# Patient Record
Sex: Female | Born: 1970 | Hispanic: Yes | Marital: Married | Smoking: Never smoker
Health system: Southern US, Community
[De-identification: ages and names within clinical notes are randomized; demographics above are authoritative.]

## PROBLEM LIST (undated history)

## (undated) DIAGNOSIS — E781 Pure hyperglyceridemia: Secondary | ICD-10-CM

## (undated) DIAGNOSIS — D259 Leiomyoma of uterus, unspecified: Secondary | ICD-10-CM

## (undated) HISTORY — PX: NO PAST SURGERIES: SHX2092

## (undated) HISTORY — DX: Leiomyoma of uterus, unspecified: D25.9

## (undated) HISTORY — DX: Pure hyperglyceridemia: E78.1

---

## 2014-08-18 ENCOUNTER — Other Ambulatory Visit: Payer: Self-pay | Admitting: Family Medicine

## 2014-08-18 ENCOUNTER — Emergency Department (INDEPENDENT_AMBULATORY_CARE_PROVIDER_SITE_OTHER)
Admission: EM | Admit: 2014-08-18 | Discharge: 2014-08-18 | Disposition: A | Discharge status: Home / Self Care | Payer: Managed Care, Other (non HMO) | Source: Home / Self Care | Attending: Family Medicine | Admitting: Family Medicine

## 2014-08-18 ENCOUNTER — Encounter: Payer: Self-pay | Admitting: *Deleted

## 2014-08-18 DIAGNOSIS — R1031 Right lower quadrant pain: Secondary | ICD-10-CM | POA: Diagnosis not present

## 2014-08-18 LAB — POCT CBC W AUTO DIFF (K'VILLE URGENT CARE)

## 2014-08-18 LAB — POCT URINALYSIS DIP (MANUAL ENTRY)
Bilirubin, UA: NEGATIVE
GLUCOSE UA: NEGATIVE
Ketones, POC UA: NEGATIVE
Leukocytes, UA: NEGATIVE
Nitrite, UA: NEGATIVE
PH UA: 7.5 (ref 5–8)
Protein Ur, POC: NEGATIVE
SPEC GRAV UA: 1.02 (ref 1.005–1.03)
Urobilinogen, UA: 0.2 (ref 0–1)

## 2014-08-18 LAB — POCT WET + KOH PREP: WBC, Wet Prep HPF POC: NONE SEEN

## 2014-08-18 LAB — POCT URINE PREGNANCY: Preg Test, Ur: NEGATIVE

## 2014-08-18 NOTE — ED Notes (Signed)
Scheduled pelvic US with Hoyle Sauer @ [facility] for 08/19/14 @ 130pm

## 2014-08-18 NOTE — ED Provider Notes (Signed)
CSN: [id number]     Arrival date & time 08/18/14  6294 History   First MD Initiated Contact with Patient 08/18/14 [phone]     Chief Complaint  Patient presents with  . Pelvic Pain  . Dysuria     HPI Comments: Patient complains of dysuria and intermittent right lower quadrant pelvic pain for about one week.  No vaginal discharge.  She has Implanon in place, but had vaginal bleeding for 4 days last week.  No fevers, chills, and sweats.  No nausea/vomiting.   Patient is a 44 y.o. female presenting with abdominal pain. The history is provided by the patient and a relative. The history is limited by a language barrier.  Abdominal Pain Pain location:  RLQ and suprapubic Pain quality: aching   Pain radiates to:  Does not radiate Pain severity:  Mild Onset quality:  Gradual Duration:  3 days Timing:  Intermittent Progression:  Unchanged Chronicity:  New Context: not previous surgeries   Relieved by:  Nothing Worsened by:  Palpation Ineffective treatments:  Acetaminophen Associated symptoms: dysuria and vaginal bleeding   Associated symptoms: no anorexia, no chills, no fever, no nausea, no vaginal discharge and no vomiting     History reviewed. No pertinent past medical history. History reviewed. No pertinent past surgical history. Family History  Problem Relation Age of Onset  . Diabetes Mother   . Hypertension Mother    History  Substance Use Topics  . Smoking status: Never Smoker   . Smokeless tobacco: Never Used  . Alcohol Use: No   OB History    No data available     Review of Systems  Constitutional: Negative for fever and chills.  Gastrointestinal: Positive for abdominal pain. Negative for nausea, vomiting and anorexia.  Genitourinary: Positive for dysuria and vaginal bleeding. Negative for vaginal discharge.  All other systems reviewed and are negative.   Allergies  Review of patient's allergies indicates no known allergies.  Home Medications   Prior to Admission  medications   Medication Sig Start Date End Date Taking? Authorizing Provider  Etonogestrel (IMPLANON [place]) Inject into the skin.   Yes Historical Provider, MD   BP 107/71 mmHg  Pulse 61  Temp(Src) 98.3 F (36.8 C) (Oral)  Resp 14  Wt 144 lb (65.318 kg)  SpO2 99%  LMP 08/10/2014 (Approximate) Physical Exam  Constitutional: She is oriented to person, place, and time. She appears well-developed and well-nourished. No distress.  HENT:  Head: Normocephalic.  Nose: Nose normal.  Mouth/Throat: Oropharynx is clear and moist.  Eyes: Conjunctivae are normal. Pupils are equal, round, and reactive to light.  Neck: Neck supple.  Cardiovascular: Normal heart sounds.   Pulmonary/Chest: Breath sounds normal.  Abdominal: Soft. Normal appearance and bowel sounds are normal. She exhibits no distension and no mass. There is no hepatosplenomegaly. There is tenderness in the right lower quadrant. There is guarding and tenderness at McBurney's point. There is no rigidity, no rebound, no CVA tenderness and negative Murphy's sign. Hernia confirmed negative in the right inguinal area and confirmed negative in the left inguinal area.    There is distinct tenderness to palpation right lower quadrant over McBurney's point as noted on diagram. Iliopsoas and obdurator tests are negative.  Genitourinary: Vagina normal. There is no rash, tenderness, lesion or injury on the right labia. There is no rash, tenderness, lesion or injury on the left labia. No erythema, tenderness or bleeding in the vagina. No foreign body around the vagina. No vaginal discharge found.  Vulva appears normal without lesions or erythema.  Vagina has normal mucosae without lesions and no discharge in the vaginal vault.  Cervix appears normal without lesions.  There is no discharge present in the cervical os.  No cervical motion tenderness is present. Uterus is small and non-tender.  Right adnexae mildly tender without masses; left adnexae normal.    Musculoskeletal: She exhibits no edema.  Lymphadenopathy:    She has no cervical adenopathy.  Neurological: She is alert and oriented to person, place, and time.  Skin: Skin is warm and dry. No rash noted.  Nursing note and vitals reviewed.   ED Course  Procedures  None     Labs Reviewed  POCT URINALYSIS DIP (MANUAL ENTRY) - Abnormal; Notable for the following:    Blood, UA trace-intact (*)    All other components within normal limits  POCT URINE PREGNANCY negative  POCT CBC W AUTO DIFF ([facility]):  WBC 5.9; LY 37.4; MO 7.9; GR 54.7; Hgb 13.1; Platelets 265 POCT KOH/Wet Prep:  Negative yeast; negative WBC; Epithelial cells present; Negative Clue cells:  Negative trich; Few bacteria.     MDM   1. Abdominal pain, right lower quadrant; normal WBC reassuring.  ?right ovarian cyst   Schedule pelvic ultrasound (tomorrow at [facility] 1:30pm) May take Ibuprofen 200mg , 4 tabs every 8 hours with food.     Kandra Nicolas, MD 08/18/14 [phone]

## 2014-08-18 NOTE — Discharge Instructions (Signed)
Recommend Ibuprofen 200mg , 4 tabs every 8 hours with food.

## 2014-08-18 NOTE — ED Notes (Signed)
Pt c/o intermittent pelvic pain and dysuria x 3 days. There is a language barrier but what i gathered, she has an implanon but does get a period and had vaginal bleeding 4 days last week which she believes was her period.

## 2014-08-19 ENCOUNTER — Ambulatory Visit (HOSPITAL_BASED_OUTPATIENT_CLINIC_OR_DEPARTMENT_OTHER): Payer: Managed Care, Other (non HMO)

## 2014-08-19 ENCOUNTER — Ambulatory Visit (HOSPITAL_BASED_OUTPATIENT_CLINIC_OR_DEPARTMENT_OTHER): Admission: RE | Admit: 2014-08-19 | Payer: Managed Care, Other (non HMO) | Source: Ambulatory Visit

## 2014-08-19 LAB — GC/CHLAMYDIA PROBE AMP
CT Probe RNA: NEGATIVE
GC Probe RNA: NEGATIVE

## 2016-05-02 ENCOUNTER — Ambulatory Visit (INDEPENDENT_AMBULATORY_CARE_PROVIDER_SITE_OTHER): Payer: Managed Care, Other (non HMO) | Admitting: Physician Assistant

## 2016-05-02 ENCOUNTER — Other Ambulatory Visit: Payer: Self-pay

## 2016-05-02 ENCOUNTER — Encounter: Payer: Self-pay | Admitting: Physician Assistant

## 2016-05-02 ENCOUNTER — Ambulatory Visit: Payer: Managed Care, Other (non HMO) | Admitting: Physician Assistant

## 2016-05-02 VITALS — BP 131/77 | HR 66 | Ht 62.0 in | Wt 150.0 lb

## 2016-05-02 DIAGNOSIS — Z0000 Encounter for general adult medical examination without abnormal findings: Secondary | ICD-10-CM

## 2016-05-02 DIAGNOSIS — E663 Overweight: Secondary | ICD-10-CM

## 2016-05-02 LAB — CBC
HCT: 39.8 % (ref 35.0–45.0)
Hemoglobin: 13.3 g/dL (ref 11.7–15.5)
MCH: 31.4 pg (ref 27.0–33.0)
MCHC: 33.4 g/dL (ref 32.0–36.0)
MCV: 93.9 fL (ref 80.0–100.0)
MPV: 10.2 fL (ref 7.5–12.5)
Platelets: 331 10*3/uL (ref 140–400)
RBC: 4.24 MIL/uL (ref 3.80–5.10)
RDW: 13.7 % (ref 11.0–15.0)
WBC: 7.7 10*3/uL (ref 3.8–10.8)

## 2016-05-02 NOTE — Patient Instructions (Signed)
Cuidados preventivos en las mujeres de entre 7 y [phone] (Preventive Care 40-64 Years, Female) Los cuidados preventivos hacen referencia a las opciones en cuanto al estilo de vida y a las visitas al mdico, las cuales pueden promover la salud y Musician. QU INCLUYEN LOS CUIDADOS PREVENTIVOS?  Un examen fsico anual. Tambin se lo conoce como control de rutina anual.  Exmenes dentales una o dos veces al ao.  Exmenes oculares de [place]. Pregntele al mdico con qu frecuencia debe hacerse controles oculares.  Opciones personales en cuanto al estilo de vida, entre ellas:  [place] y las piezas dentales.  Realizar actividad fsica con regularidad.  Consumir una dieta saludable.  Evitar el consumo de tabaco y de drogas.  Limitar el consumo de bebidas alcohlicas.  Counsellor.  Tomar aspirina de baja dosis diariamente a [employer].  Tomar los suplementos vitamnicos y [employer] se lo haya indicado el mdico. QU SUCEDE DURANTE UN CONTROL DE [place]?  Los servicios y las pruebas de deteccin que el mdico realiza durante un control de rutina anual dependern de la edad, el estado de salud general, los factores de riesgo relacionados con el estilo de vida y los antecedentes familiares de enfermedades. Psicoterapia  El mdico puede hacerle preguntas sobre lo siguiente:  Consumo de alcohol.  Consumo de tabaco.  Consumo de drogas.  [place] familiar y en las relaciones.  Actividad sexual.  Hbitos de alimentacin.  Trabajo y entorno laboral.  Mtodos anticonceptivos.  El ciclo menstrual.  Antecedentes de embarazos. Pruebas de deteccin  Pueden hacerle las siguientes pruebas o mediciones:  Estatura, peso e ndice de masa corporal [facility]).  La presin arterial.  Niveles de lpidos y colesterol. Estos se pueden controlar cada 5aos o con mayor frecuencia si tiene ms de  50aos.  Controles de la piel.  Pruebas de deteccin de cncer de pulmn. Pueden hacerle estas pruebas todos los aos a partir de los 55aos si ha fumado durante 30aos un paquete diario y sigue fumando o dej el hbito en algn momento en los ltimos 15aos.  Prueba de Personnel officer en las heces [facility]). Pueden hacerle esta prueba todos los aos a partir de [place].  Sigmoidoscopia flexible o colonoscopia. Pueden hacerle una sigmoidoscopia cada 71aos o una colonoscopia cada 10aos a partir de los 50aos.  Anlisis de sangre de hepatitisC.  Anlisis de sangre de hepatitisB.  Pruebas de deteccin de enfermedades de transmisin sexual (ETS).  Pruebas de deteccin de la diabetes. Para realizarlas, se controla el nivel de azcar en la sangre (glucemia) despus de que haya pasado un rato sin comer (ayuno). Pueden hacerle esta prueba cada 1 a 3aos.  Mamografa. Pueden hacerle este estudio cada 1 o 2aos. Hable con el mdico acerca de cundo debe comenzar a Nutritional therapist. Esto depende de si tiene antecedentes familiares de cncer de mama.  Pruebas de deteccin de tumores malignos relacionados con el BRCA. Se pueden realizar si tiene antecedentes familiares de cncer de mama, de ovarios, de trompas o de peritoneo.  Examen plvico y prueba de Papanicolaou. Se pueden realizar cada 3aos, a partir de los 21aos. A partir de los 30aos, se pueden realizar cada 5aos si le hacen una prueba de Papanicolaou en combinacin con un anlisis del virus del Engineer, technical sales (VPH).  Densitometra sea. Se realiza para detectar la presencia de osteoporosis. Pueden hacerle este estudio si corre un alto riesgo de tener osteoporosis. Hable con el mdico  sobre los [employer], las opciones de tratamiento y, si es necesario, la necesidad de Optometrist ms [employer]. Vacunas  El mdico puede recomendarle que se aplique algunas vacunas, por ejemplo:  [place] antigripal. Se  recomienda su aplicacin todos los aos.  Vacuna contra la difteria, el ttanos y Research officer, trade union (Tdap, Td). Puede que tenga que aplicarse un refuerzo contra el ttanos y la difteria (Td) cada 10aos.  Vacuna contra la varicela. Es posible que tenga que aplicrsela si no recibi esta vacuna.  Vacuna contra el herpes zster. Tal vez deba aplicrsela despus de los 60aos.  Vacuna contra el sarampin, la rubola y las paperas ([place]). Tal vez deba aplicarse al menos una dosis de SRP si naci [phone] o despus de ese ao. Podra tambin necesitar una segunda dosis.  Vacuna antineumoccica conjugada 13valente (PCV13). Puede necesitar esta vacuna si tiene determinadas enfermedades y no se vacun anteriormente.  Vacuna antineumoccica de polisacridos (PPSV23). Quizs tenga que aplicarse una o dos dosis si fuma o si sufre determinadas enfermedades.  Vacuna antimeningoccica. Puede necesitar esta vacuna si tiene determinadas enfermedades.  Vacuna contra la hepatitis A. Es posible que tenga que aplicarse esta vacuna si tiene determinadas enfermedades, o si trabaja en lugares donde puede estar expuesta a la hepatitisA o viaja a estas zonas.  Vacuna contra la hepatitis B. Es posible que tenga que aplicarse esta vacuna si tiene determinadas enfermedades, o si trabaja en lugares donde puede estar expuesta a la hepatitisB o viaja a estas zonas.  Vacuna antihaemophilus influenzae tipoB (Hib). Puede necesitar esta vacuna si tiene determinadas enfermedades. Hable con el [place] deteccin y las vacunas que tiene que [place], y con qu frecuencia debe hacerlo. Esta informacin no tiene Marine scientist el consejo del mdico. Asegrese de hacerle al mdico cualquier pregunta que tenga. Document Reviewed: 12/19/2014 Elsevier Interactive Patient Education  2017 [place] dental preventivo en los adultos (Preventive Dental Care, Adult) El cuidado dental  preventivo incluye visitar al dentista con regularidad y mantener una buena higiene bucal en el hogar. Estas medidas pueden ayudar a prevenir las caries y otros problemas dentales, problemas en el canal radicular, enfermedad en las encas (gingivitis) y la prdida de dientes. Los exmenes dentales regulares tambin pueden ayudar al dentista a diagnosticar otros problemas mdicos. Muchas enfermedades, incluido el cncer en la boca, presentan signos iniciales que pueden detectarse durante una visita de cuidado dental preventivo. QU ESPERAR EN LAS VISITAS AL DENTISTA? Muchos adultos visitan al dentista una o dos veces al ao para que les hagan una limpieza y exmenes bucales. Hable con el dentista sobre el mejor cuidado dental preventivo para usted. En la visita, el dentista puede hacerle preguntas acerca de lo siguiente:  Su dieta y salud general.  Cualquier sntoma nuevo, como los siguientes:  Encas sangrantes.  Dolor en la boca, los dientes o la [place]. El Production assistant, radio la boca (examen bucal) para verificar lo siguiente:  Caries.  Gingivitis u otros problemas.  Signos de cncer.  Hinchazn o bultos en el cuello.  Movimiento anormal de la mandbula o dolor en la articulacin de la [place]. Probablemente tambin deba hacerse lo siguiente:  Radiografas dentales.  Limpieza dental. Si usted tiene un problema en una etapa inicial, como una caries, el dentista programar una cita para brindarle tratamiento. Si tiene un problema en la raz del diente, enfermedad en las encas o un signo de otra enfermedad, el dentista puede derivarlo a Office manager. DE  QU FORMA PUEDO CUIDARME LOS DIENTES EN MI CASA?  Cepllese los dientes con una pasta dental con flor aprobada todas las [place] y las noches. Si es posible, cepllese antes de que pasen 63mnutos de haber terminado cada comida.  Psese el hilo dental una vez al da, tUS Airways  Despus del cepillado, contrlese  de forma peridica los dientes para ver si tienen manchas marrones o blancas. Estas pueden ser signos de caries.  Examine las encas para detectar sangrado o hinchazn. Estos pueden ser signos de enfermedad en las encas, como gingivitis o periodontitis.  Asegrese de que su dieta incluya muchas frutas, verduras, lBahrainy otros productos lcteos, cereales integrales y protenas. No coma muchos alimentos con almidn o azcar agregada. Hable con el mdico si tiene preguntas sobre cmo seguir una dieta saludable.  EIraancolaciones azucaradas y los cNurse, mental health  No fume.  No se haga perforaciones en la boca.  Si tiene dRegions Financial Corporationo las encas, haga grgaras con una mezcla de agua y sal 3 o 4veces al da, o cuando sea necesario. Para preparar la mezcla de agua con sal, disuelva por completo media a 1cucharadita de sal en 1taza de agua tibia.  Tome los medicamentos de venta libre y los recetados solamente como se lo haya indicado el dentista.  Si se le cae un diente permanente debido a un golpe:  ESoftware engineer  Recjalo desde la parte de arriba (corona) con un pauelo de papel o una gasa.  Lvelo durante un mximo de 10segundos con agua corriente fra.  Trate de volver a ponerlo en el hueco de la enca.  Si no puede volver a cCivil Service fast streamer pngalo en un vaso con lNortheast Utilities  Vaya al consultorio del dentista de inmediato. Lleve el diente. CBrownton Llame al dentista si presenta lo siguiente:  DLiberty Global los dientes o la mM[place]  Enrojecimiento, hinchazn o sangrado en las encas.  Uno o ms dientes muy sensibles al calor o al fro.  Aliento muy feo.  Un problema con un empaste, una corona, un implante o la dentadura postiza.  Un diente roto o flojo.  Un crecimiento o una lcera que no desaparecen en la boca. PMarthenia RollingMS INFORMACIN Asociacin Odontolgica Estadounidense  (American Dental Association): whttp://clayton-rivera.info/Esta informacin no tiene cMarine scientistel consejo del mdico. Asegrese de hacerle al mdico cualquier pregunta que tenga. Document Released: 01/29/2015 Document Revised: 01/29/2015 Document Reviewed: 08/01/2014 Elsevier Interactive Patient Education  2017 EReynolds American

## 2016-05-02 NOTE — Progress Notes (Addendum)
HPI:                                                                Sabrina Berger is a 46 y.o. female who presents to [place]: [place] [facility] today for employment physical exam  Current Concerns include: would like to have biometric form completed for her work physical  Health Maintenance There are no preventive care reminders to display for this patient.  GYN/Sexual Health  Followed by GYN  Last pap smear: 09/21/13, negative for IEL, HPV neg  History of abnormal pap smears: no  Sexually active: not currently  Current contraception: Nexplanon (01/2013) and OCP's  Health Habits  Diet: fair  Exercise: walks x 30 min   Past Medical History:  Diagnosis Date  . Hypertriglyceridemia   . Uterine fibroid    Past Surgical History:  Procedure Laterality Date  . NO PAST SURGERIES     Social History  Substance Use Topics  . Smoking status: Never Smoker  . Smokeless tobacco: Never Used  . Alcohol use No   family history includes Diabetes in her mother; Hypertension in her mother.  ROS: negative except as noted in the HPI  Medications: Current Outpatient Prescriptions  Medication Sig Dispense Refill  . Etonogestrel (IMPLANON [place]) Inject into the skin.    Marland Kitchen SPRINTEC 28 0.25-35 MG-MCG tablet      No current facility-administered medications for this visit.    No Known Allergies     Objective:  BP 131/77   Pulse 66   Ht 5\' 2"  (1.575 m)   Wt 150 lb (68 kg)   BMI 27.44 kg/m  Gen: well-groomed, cooperative, not ill-appearing, no distress HEENT: normal conjunctiva, TM's clear, poor dentition, oropharynx clear, moist mucus membranes, no thyromegaly or tenderness Pulm: Normal work of breathing, clear to auscultation bilaterally CV: Normal rate, regular rhythm, s1 and s2 distinct, no murmurs, clicks or rubs appreciated on this exam, no carotid bruit, no peripheral edema GI: soft, nondistended, nontender, no masses Neuro: alert and  oriented x 3, EOM's intact, PERRLA, DTR's intact MSK: strength 5/5 and symmetric, normal gait  Skin: warm and dry, no rashes or lesions on exposed skin Psych: normal affect, pleasant mood, normal speech and thought content   No results found for this or any previous visit (from the past 72 hour(s)). No results found.    Assessment and Plan: 46 y.o. female with   1. Encounter for preventative adult health care examination - CBC - COMPLETE METABOLIC PANEL WITH GFR - Hemoglobin A1c - Lipid Panel w/reflex Direct LDL - Pap up to date - Tdap up to date per patient - Mammo - patient will obtain through GYN   Patient education and anticipatory guidance given Patient agrees with treatment plan Follow-up in 1 year or sooner as needed  Darlyne Russian PA-C

## 2016-05-03 LAB — LIPID PANEL W/REFLEX DIRECT LDL
Cholesterol: 154 mg/dL (ref <200)
HDL: 87 mg/dL (ref >50)
LDL-Cholesterol: 48 mg/dL
NON-HDL CHOLESTEROL (CALC): 67 mg/dL (ref <130)
Total CHOL/HDL Ratio: 1.8 Ratio (ref <5.0)
Triglycerides: 106 mg/dL (ref <150)

## 2016-05-03 LAB — COMPLETE METABOLIC PANEL WITHOUT GFR
ALT: 28 U/L (ref 6–29)
AST: 30 U/L (ref 10–35)
Albumin: 3.8 g/dL (ref 3.6–5.1)
Alkaline Phosphatase: 34 U/L (ref 33–115)
BUN: 9 mg/dL (ref 7–25)
CO2: 24 mmol/L (ref 20–31)
Calcium: 8.8 mg/dL (ref 8.6–10.2)
Chloride: 101 mmol/L (ref 98–110)
Creat: 0.67 mg/dL (ref 0.50–1.10)
GFR, Est African American: >89 mL/min
GFR, Est Non African American: >89 mL/min
Glucose, Bld: 67 mg/dL (ref 65–99)
Potassium: 4.3 mmol/L (ref 3.5–5.3)
Sodium: 139 mmol/L (ref 135–146)
Total Bilirubin: 0.5 mg/dL (ref 0.2–1.2)
Total Protein: 6.9 g/dL (ref 6.1–8.1)

## 2016-05-03 LAB — HEMOGLOBIN A1C
HEMOGLOBIN A1C: 5.1 % (ref <5.7)
Mean Plasma Glucose: 100 mg/dL

## 2016-05-09 ENCOUNTER — Ambulatory Visit: Payer: Managed Care, Other (non HMO) | Admitting: Physician Assistant

## 2017-01-21 ENCOUNTER — Ambulatory Visit (INDEPENDENT_AMBULATORY_CARE_PROVIDER_SITE_OTHER): Payer: 59 | Admitting: Obstetrics & Gynecology

## 2017-01-21 ENCOUNTER — Encounter: Payer: Self-pay | Admitting: *Deleted

## 2017-01-21 ENCOUNTER — Encounter: Payer: Self-pay | Admitting: Obstetrics & Gynecology

## 2017-01-21 VITALS — BP 138/82 | HR 75 | Resp 16 | Ht 62.0 in | Wt 148.0 lb

## 2017-01-21 DIAGNOSIS — D219 Benign neoplasm of connective and other soft tissue, unspecified: Secondary | ICD-10-CM | POA: Diagnosis not present

## 2017-01-21 DIAGNOSIS — Z124 Encounter for screening for malignant neoplasm of cervix: Secondary | ICD-10-CM | POA: Diagnosis not present

## 2017-01-21 DIAGNOSIS — Z0000 Encounter for general adult medical examination without abnormal findings: Secondary | ICD-10-CM

## 2017-01-21 DIAGNOSIS — N938 Other specified abnormal uterine and vaginal bleeding: Secondary | ICD-10-CM

## 2017-01-21 DIAGNOSIS — Z1151 Encounter for screening for human papillomavirus (HPV): Secondary | ICD-10-CM

## 2017-01-21 LAB — CBC
HCT: 37.3 % (ref 35.0–45.0)
Hemoglobin: 12.5 g/dL (ref 11.7–15.5)
MCH: 31.3 pg (ref 27.0–33.0)
MCHC: 33.5 g/dL (ref 32.0–36.0)
MCV: 93.5 fL (ref 80.0–100.0)
MPV: 11 fL (ref 7.5–12.5)
Platelets: 283 10*3/uL (ref 140–400)
RBC: 3.99 10*6/uL (ref 3.80–5.10)
RDW: 12.5 % (ref 11.0–15.0)
WBC: 8 10*3/uL (ref 3.8–10.8)

## 2017-01-21 LAB — TSH: TSH: 1.04 mIU/L

## 2017-01-21 NOTE — Progress Notes (Signed)
Patient ID: Sabrina Berger, female   DOB: 30-Jun-1970, 46 y.o.   MRN: [id number]  Chief Complaint  Patient presents with  . Menstrual Problem    HPI Sabrina Berger is a 46 y.o. female.  Sabrina Berger here with a problem of heavy bleeding, dailly, for 2 years. She may stop for a week on occasion. She had an u/s in 2015 that showed a 6 cm fibroid. Her hbg was 13.3 in March of this year.   HPI  Past Medical History:  Diagnosis Date  . Hypertriglyceridemia   . Uterine fibroid     Past Surgical History:  Procedure Laterality Date  . NO PAST SURGERIES      Family History  Problem Relation Age of Onset  . Diabetes Mother   . Hypertension Mother   . Uterine cancer Paternal Aunt     Social History Social History   Tobacco Use  . Smoking status: Never Smoker  . Smokeless tobacco: Never Used  Substance Use Topics  . Alcohol use: No  . Drug use: No    No Known Allergies  Current Outpatient Medications  Medication Sig Dispense Refill  . SPRINTEC 28 0.25-35 MG-MCG tablet     . Etonogestrel (IMPLANON [place]) Inject into the skin.     No current facility-administered medications for this visit.     Review of Systems Review of Systems She uses OCPs for contraception, but she does not remember the name of them.  Blood pressure 138/82, pulse 75, resp. rate 16, height 5\' 2"  (1.575 m), weight 148 lb (67.1 kg).  Physical Exam Physical Exam Well nourished, well hydrated white female, no apparent distress Breathing, conversing, and ambulating normally Interpretor present for encounter Abd- benign Cervix appears normal, no bleeding today noted Pap smear obtained Bimanual exam reveals a mobile 14 week size uterus Moderate pubic arch  Data Reviewed U/S from [facility]  Assessment    DUB    Plan    Check gyn  U/s, cbc, tsh Pap smear today Come back 4 weeks       Sabrina Berger C Sabrina Berger 01/21/2017, 11:02 AM

## 2017-01-26 LAB — CYTOLOGY - PAP
Diagnosis: NEGATIVE
HPV (WINDOPATH): NOT DETECTED

## 2017-01-30 ENCOUNTER — Ambulatory Visit (INDEPENDENT_AMBULATORY_CARE_PROVIDER_SITE_OTHER): Payer: 59

## 2017-01-30 ENCOUNTER — Other Ambulatory Visit: Payer: 59

## 2017-01-30 DIAGNOSIS — D250 Submucous leiomyoma of uterus: Secondary | ICD-10-CM

## 2017-01-30 DIAGNOSIS — D219 Benign neoplasm of connective and other soft tissue, unspecified: Secondary | ICD-10-CM

## 2017-02-09 ENCOUNTER — Ambulatory Visit: Payer: 59 | Admitting: Obstetrics & Gynecology

## 2017-02-12 ENCOUNTER — Ambulatory Visit (INDEPENDENT_AMBULATORY_CARE_PROVIDER_SITE_OTHER): Payer: 59 | Admitting: Obstetrics and Gynecology

## 2017-02-12 ENCOUNTER — Encounter: Payer: Self-pay | Admitting: *Deleted

## 2017-02-12 ENCOUNTER — Encounter: Payer: Self-pay | Admitting: Obstetrics and Gynecology

## 2017-02-12 VITALS — BP 132/83 | HR 90 | Resp 16 | Ht 62.0 in | Wt 148.0 lb

## 2017-02-12 DIAGNOSIS — Z712 Person consulting for explanation of examination or test findings: Secondary | ICD-10-CM

## 2017-02-12 DIAGNOSIS — N938 Other specified abnormal uterine and vaginal bleeding: Secondary | ICD-10-CM

## 2017-02-12 NOTE — Patient Instructions (Signed)
Eleccin del mtodo anticonceptivo (Contraception Choices) La anticoncepcin (control de la natalidad) es el uso de cualquier mtodo o dispositivo para Therapist, occupational. A continuacin se indican algunos de esos mtodos. [place] contraconceptivo consiste en un tubo plstico delgado que contiene la hormona progesterona. No contiene estrgenos. El mdico inserta el tubo en la parte interna del brazo. El tubo puede Nutritional therapist durante 3 aos. Despus de los 3 aos debe retirarse. El implante impide que los ovarios liberen vulos (ovulacin), espesa el moco cervical, lo que evita que los espermatozoides ingresen al tero y hace ms delgada la membrana que cubre el interior del tero.  Inyecciones de progesterona sola: las Water engineer cada 3 meses para Therapist, occupational. La progesterona sinttica impide que los ovarios liberen vulos. Tambin hacen que el moco cervical se espese y modifique el tejido de recubrimiento interno del tero. Esto hace ms difcil que los espermatozoides sobrevivan en el tero.  Las pldoras anticonceptivas contienen estrgenos y Immunologist. Su funcin es [employer] ovarios liberen vulos (ovulacin). Las hormonas de los anticonceptivos orales hacen que el moco cervical se haga ms espeso, lo que evita que el esperma ingrese al tero. Las pldoras anticonceptivas son recetadas por el mdico.Tambin se utilizan para tratar los perodos menstruales abundantes.  Minipldora: este tipo de pldora anticonceptiva contiene slo hormona progesterona. Deben tomarse todos los das del mes y debe recetarlas el mdico.  El parche de control de natalidad: contiene hormonas similares a las que contienen las pldoras anticonceptivas. Deben cambiarse una vez por semana y se utilizan bajo prescripcin mdica.  Anillo vaginal: contiene hormonas similares a las que contienen las pldoras anticonceptivas. Se deja colocado durante tres semanas, se  lo retira durante 1 semana y luego se coloca uno nuevo. La paciente debe sentirse cmoda al insertar y retirar el anillo de la vagina.Es necesaria la prescripcin mdica.  Anticonceptivos de emergencia: son mtodos para evitar un embarazo despus de Ardelia Mems relacin sexual sin proteccin. Esta pldora puede tomarse inmediatamente despus de Office manager sexuales o hasta 5 [place] de haber tenido sexo sin proteccin. Es ms efectiva si se toma poco tiempo despus de la relacin sexual. Los anticonceptivos de emergencia estn disponibles sin prescripcin mdica. Consltelo con su farmacutico. No use los anticonceptivos de emergencia como nico mtodo anticonceptivo.  MTODOS DE [place]  Condn masculino: es una vaina delgada (ltex o goma) que se coloca cubriendo al pene durante el acto sexual. Grier Rocher con espermicida para aumentar la efectividad.  Condn femenino. Es una funda delicada y blanda que se adapta holgadamente a la vagina antes de las Armed forces operational officer.  Diafragma: es una barrera de ltex redonda y suave que debe ser recomendado por un profesional. Se inserta en la vagina, junto con un gel espermicida. Debe insertarse antes de Clinical biochemist. Debe dejar el diafragma colocado en la vagina durante 6 a 8 horas despus de la relacin sexual.  Capuchn cervical: es una barrera de ltex o taza plstica redonda y [place] que cubre el cuello del tero y debe ser colocada por un mdico. Puede dejarlo colocado en la vagina hasta 48 horas despus [place].  Esponja: es una pieza blanda y circular de espuma de poliuretano. Contiene un espermicida. Se inserta en la vagina despus de mojarla y antes de las [employer].  Espermicidas: son sustancias qumicas que matan o bloquean al esperma y no lo dejan ingresar al cuello del tero y al tero. Vienen en  forma de cremas, geles, supositorios, espuma o comprimidos. No es necesario tener Furniture conservator/restorer. Se insertan en  la vagina con un aplicador antes de Clinical biochemist. El proceso debe repetirse cada vez que tiene relaciones sexuales.  ANTICONCEPTIVOS INTRAUTERINOS  Dispositivo intrauterino (DIU) es un dispositivo en forma de T que se coloca en el tero durante el perodo menstrual, para Therapist, occupational. Hay dos tipos: ? DIU de cobre: este tipo de DIU est recubierto con un alambre de cobre y se inserta dentro del tero. El cobre hace que el tero y las trompas de Falopio produzcan un liquido que [employer] espermatozoides. Puede permanecer colocado durante 10 aos. ? DIU con hormona: este tipo de DIU contiene la hormona progestina (progesterona sinttica). La hormona espesa el moco cervical y evita que los espermatozoides ingresen al tero y tambin afina la membrana que cubre el tero para evitar la implantacin del vulo fertilizado. La hormona debilita o destruye los espermatozoides que ingresan al tero. Puede Nutritional therapist durante 3-5 aos, segn el tipo de DIU que se [place].  MTODOS ANTICONCEPTIVOS PERMANENTES  Ligadura de trompas en la mujer: se realiza sellando, atando u obstruyendo quirrgicamente las trompas de Falopio lo que impide que el vulo descienda hacia el tero.  Esterilizacin histeroscpica: Implica la colocacin de un pequeo espiral o la insercin en cada trompa de Falopio. El mdico utiliza una tcnica llamada histeroscopa para Teacher, music procedimiento. El dispositivo produce la formacin de tejido Pensions consultant. Esto da como resultado una obstruccin permanente de las trompas de Falopio, de modo que la esperma no pueda fertilizar el vulo. Demora alrededor de 3 meses despus del procedimiento hasta que el conducto se obstruye. Tendr que usar otro mtodo anticonceptivo durante al menos 3 meses.  Esterilizacin masculina: se realiza ligando los conductos por los que pasan los espermatozoides (vasectoma).Esto impide que el esperma ingrese a la vagina durante el  acto sexual. Luego del procedimiento, el hombre puede eyacular lquido (semen).  MTODOS DE PLANIFICACIN NATURAL  Planificacin familiar natural: consiste en no Clinical biochemist o usar un mtodo de barrera (condn, [place], capuchn cervical) en los [employer] la mujer podra quedar [place].  Mtodo de calendario: consiste en el seguimiento de la duracin de cada ciclo menstrual y la identificacin de los perodos frtiles.  Mtodo de ovulacin: Dance movement psychotherapist las relaciones sexuales durante la ovulacin.  Mtodo sintotrmico: Energy manager sexuales en la poca en la que se est ovulando, utilizando un termmetro y tendiendo en cuenta los sntomas de la ovulacin.  Mtodo postovulacin: Museum/gallery conservator las relaciones sexuales para despus de haber ovulado. Independientemente del tipo o mtodo anticonceptivo que usted elija, es importante que use condones para protegerse contra las infecciones de transmisin sexual (ETS). Hable con su mdico con respecto a qu mtodo anticonceptivo es el ms apropiado para usted. Esta informacin no tiene Marine scientist el consejo del mdico. Asegrese de hacerle al mdico cualquier pregunta que tenga. Document Released: 02/17/2005 Document Revised: 10/20/2012 Document Reviewed: 08/12/2012 Elsevier Interactive Patient Education  2017 [place] endometrio (Endometrial Ablation) En la ablacin del endometrio se extirpa el recubrimiento interno del tero (endometrio). Este es generalmente un tratamiento que se Scientist, water quality, en forma ambulatoria. La ablacin permite evitar una ciruga mayor, como la ciruga para extirpar el cuello del tero y Nurse, learning disability (histerectoma). Luego de la ablacin del endometrio, tendr poco o nada de flujo menstrual y no podr tener hijos. Sin  embargo, si se encuentra en la etapa de la premenopausia, deber usar un mtodo confiable de control de la natalidad luego del  procedimiento, ya que hay una pequea probabilidad de que ocurra un Media planner. Hay diferentes razones para llevar a cabo este procedimiento, ellas son:  Perodos abundantes.  Sangrado que causa anemia.  Sangrado irregular.  Fibromas que sangran en la superficie interior del tero, si no miden ms de 3 cm. Tal vez no sea posible realizarle este procedimiento si:  Desea tener hijos en el futuro.  Tiene clicos intensos durante el perodo menstrual.  Tiene clulas precancerosas o cancerosas en el tero.  Tuvo un embarazo reciente.  Est cursando la menopausia.  Se someti a [place] mayor de tero, la cual le produjo el afinamiento de la pared [place]. Las cirugas pueden incluir lo siguiente: ? La extirpacin de uno o ms fibromas uterinos (miomectoma). ? Ardelia Mems cesrea con una incisin clsica (vertical) en el tero. Pregntele al mdico qu tipo de Pensions consultant. En ocasiones, la Arts development officer piel es diferente de la que se forma en el tero. Aunque se haya sometido a una ciruga uterina, algunos tipos de ablacin an pueden ser seguros para su caso. Hable con el mdico. INFORME A SU MDICO:  Cualquier alergia que tenga.  Todos los [employer] [place], incluyendo vitaminas, hierbas, gotas oftlmicas, cremas y medicamentos de venta libre.  Problemas previos que usted o los [employer] de su familia hayan tenido con el uso de anestsicos.  Enfermedades de [employer].  Cirugas previas.  Padecimientos mdicos.  RIESGOS Y COMPLICACIONES Generalmente, este es un procedimiento seguro. Sin embargo, Games developer procedimiento, pueden surgir complicaciones. Las complicaciones posibles son:  Perforacin del tero.  Hemorragias.  Infeccin en el tero, vejiga, o vagina.  Lesin en los rganos circundantes.  Ardelia Mems burbuja de aire en un pulmn (embolia de aire).  Embarazo luego del procedimiento.  Si el procedimiento no resuelve el problema, ser necesaria una  histerectoma.  Disminucin de la capacidad para Community education officer en el recubrimiento interno del tero. ANTES DEL PROCEDIMIENTO  Deber controlarse la membrana que recubre el tero para asegurarse de que no hay clulas cancerosas o precancerosas.  Le harn una ecografa para observar el tamao del tero y verificar si hay anormalidades.  Le darn medicamentos para Engineer, structural de la membrana que recubre el tero.  PROCEDIMIENTO Durante el procedimiento, el medico usar un instrumento llamado resectoscopio para ver el interior del tero. Hay diferentes formas de extirpar la membrana que cubre el tero.  Radiofrecuencia: en este mtodo se utiliza un aparato de radiofrecuencia, corriente elctrica alterna, para extirpar la membrana interna del tero.  Crioterapia: en este mtodo se [place] fro extremo para congelar la membrana del tero.  Lquido caliente: en este mtodo se utiliza solucin salina con el mismo objetivo.  Microondas: Se utilizan microondas de alta energa para calentar la membrana y extirparla.  Baln termal: consiste en insertar un catter con un baln en la punta dentro del tero. La punta del baln contiene un lquido caliente que elimina la membrana que tapiza el [place]. DESPUS DEL PROCEDIMIENTO Despus del procedimiento, no tenga relaciones sexuales ni inserte nada en la vagina hasta que su mdico la autorice. Despus del procedimiento podr experimentar:  Clicos.  Flujo vaginal.  Ganas de orinar con frecuencia. Esta informacin no tiene Marine scientist el consejo del mdico. Asegrese de hacerle al mdico cualquier pregunta que tenga. Document Released: 10/20/2012 Document Revised: 11/08/2014 Document Reviewed: 07/21/2012 Elsevier  Interactive Patient Education  2017 [employer].

## 2017-02-12 NOTE — Progress Notes (Signed)
46 yo [id number] here to discuss test results. Patient reports a long history of abnormal uterine bleeding. She reports irregular bleeding with Nexplanon in place. nexplanon was removed 3 years ago and patient was started on OCP. She reports daily spotting with OCP and over the past few months experiencing heavier vaginal bleeding for most of the month with 1 week bleeding-free interval. Patient denies any cramping or passage of clots.  Past Medical History:  Diagnosis Date  . Hypertriglyceridemia   . Uterine fibroid    Past Surgical History:  Procedure Laterality Date  . NO PAST SURGERIES     Family History  Problem Relation Age of Onset  . Diabetes Mother   . Hypertension Mother   . Uterine cancer Paternal Aunt    Social History   Tobacco Use  . Smoking status: Never Smoker  . Smokeless tobacco: Never Used  Substance Use Topics  . Alcohol use: No  . Drug use: No   ROS See pertinent in HPI  Blood pressure 132/83, pulse 90, resp. rate 16, height 5\' 2"  (1.575 m), weight 148 lb (67.1 kg). GENERAL: Well-developed, well-nourished female in no acute distress.  ABDOMEN: Soft, nontender, nondistended. No organomegaly. PELVIC: Normal external female genitalia. Vagina is pink and rugated.  Normal discharge. Normal appearing cervix. Uterus is normal in size. No adnexal mass or tenderness. EXTREMITIES: No cyanosis, clubbing, or edema, 2+ distal pulses.   01/2017 ultrasound FINDINGS: Uterus  Measurements: 11.1 x 6.7 x 7.2 cm. Heterogeneous myometrial echogenicity. Posterior mid LEFT uterine mass likely a leiomyoma 3.7 x 3.4 x 4.9 cm. This extends submucosal. No additional uterine masses. Incidental note of a small nabothian cysts in cervix.  Endometrium  Thickness: 8 mm thick, normal. No endometrial fluid or focal abnormality  Right ovary  Measurements: 3.3 x 1.5 x 2.1 cm. Normal morphology without mass. Internal blood flow present on color Doppler imaging.  Left  ovary  Not visualized on transvaginal imaging question due to high position versus obscuration by bowel gas.  Other findings:  No free pelvic fluid or adnexal masses  IMPRESSION: Submucosal uterine leiomyoma mid LEFT uterus posteriorly 3.7 x 3.4 x 4.9 cm.  Nonvisualization of LEFT ovary.   Electronically Signed   By: Lavonia Dana M.D.   On: 01/30/2017 12:54  A/P 46 yo with DUB - reviewed ultrasound finding - discussed benefits of endometrial biopsy ENDOMETRIAL BIOPSY     The indications for endometrial biopsy were reviewed.   Risks of the biopsy including cramping, bleeding, infection, uterine perforation, inadequate specimen and need for additional procedures  were discussed. The patient states she understands and agrees to undergo procedure today. Consent was signed. Time out was performed. Urine HCG was negative. A sterile speculum was placed in the patient's vagina and the cervix was prepped with Betadine. A single-toothed tenaculum was placed on the anterior lip of the cervix to stabilize it. The uterine cavity was sounded to a depth of 11 cm using the uterine sound. The 3 mm pipelle was introduced into the endometrial cavity without difficulty, 2 passes were made.  A  moderate amount of tissue was  sent to pathology. The instruments were removed from the patient's vagina. Minimal bleeding from the cervix was noted. The patient tolerated the procedure well.  Routine post-procedure instructions were given to the patient. The patient will follow up in two weeks to review the results and for further management.   - Pending results, discussed medical management with contraception such as IUD or surgical management  with endometrial biopsy - Patient will be contacted with results and will let us know of which management option she opted for - Screening mammogram ordered

## 2017-02-19 ENCOUNTER — Encounter (INDEPENDENT_AMBULATORY_CARE_PROVIDER_SITE_OTHER): Payer: Self-pay | Admitting: *Deleted

## 2017-02-20 ENCOUNTER — Ambulatory Visit: Payer: 59

## 2017-02-25 ENCOUNTER — Telehealth: Payer: Self-pay | Admitting: *Deleted

## 2017-02-25 NOTE — Telephone Encounter (Signed)
-----   Message from Mora Bellman, MD sent at 02/17/2017  1:17 PM EST ----- Please inform patient of negative endometrial biopsy for cancer. Her irregular bleeding is due to her transition towards menopause. She can return to the office to pursue medical management with IUD or endometrial ablation as previously discussed

## 2017-02-25 NOTE — Telephone Encounter (Signed)
Pt notified in person of her negative Endo Bx.  The bleeding is coming from her transition into menopause and can make appt to discuss opts.

## 2017-03-13 ENCOUNTER — Ambulatory Visit (INDEPENDENT_AMBULATORY_CARE_PROVIDER_SITE_OTHER): Payer: 59

## 2017-03-13 DIAGNOSIS — Z1231 Encounter for screening mammogram for malignant neoplasm of breast: Secondary | ICD-10-CM

## 2017-03-13 DIAGNOSIS — Z712 Person consulting for explanation of examination or test findings: Secondary | ICD-10-CM

## 2017-04-23 ENCOUNTER — Ambulatory Visit (INDEPENDENT_AMBULATORY_CARE_PROVIDER_SITE_OTHER): Payer: 59 | Admitting: Obstetrics and Gynecology

## 2017-04-23 ENCOUNTER — Encounter: Payer: Self-pay | Admitting: Obstetrics and Gynecology

## 2017-04-23 VITALS — BP 113/78 | HR 68 | Wt 148.0 lb

## 2017-04-23 DIAGNOSIS — N938 Other specified abnormal uterine and vaginal bleeding: Secondary | ICD-10-CM | POA: Diagnosis not present

## 2017-04-23 MED ORDER — MEGESTROL ACETATE 40 MG PO TABS: 40.0000 mg | ORAL_TABLET | Freq: Two times a day (BID) | ORAL | 5 refills | Status: DC

## 2017-04-23 NOTE — Progress Notes (Signed)
47 yo [id number] here for follow up on DUB. Patient reports daily spotting. She denies any cramping pain or abnormal discharge. She has been using OCP daily as instructed.  Past Medical History:  Diagnosis Date  . Hypertriglyceridemia   . Uterine fibroid    Past Surgical History:  Procedure Laterality Date  . NO PAST SURGERIES     Family History  Problem Relation Age of Onset  . Diabetes Mother   . Hypertension Mother   . Uterine cancer Paternal Aunt    Social History   Tobacco Use  . Smoking status: Never Smoker  . Smokeless tobacco: Never Used  Substance Use Topics  . Alcohol use: No  . Drug use: No   ROS See pertinent in HPI  Blood pressure 113/78, pulse 68, weight 148 lb (67.1 kg), last menstrual period 04/16/2017. GENERAL: Well-developed, well-nourished female in no acute distress.  ABDOMEN: Soft, nontender, nondistended. No organomegaly. PELVIC: Declined EXTREMITIES: No cyanosis, clubbing, or edema, 2+ distal pulses.  01/2017 sono FINDINGS: Uterus  Measurements: 11.1 x 6.7 x 7.2 cm. Heterogeneous myometrial echogenicity. Posterior mid LEFT uterine mass likely a leiomyoma 3.7 x 3.4 x 4.9 cm. This extends submucosal. No additional uterine masses. Incidental note of a small nabothian cysts in cervix.  Endometrium  Thickness: 8 mm thick, normal. No endometrial fluid or focal abnormality  Right ovary  Measurements: 3.3 x 1.5 x 2.1 cm. Normal morphology without mass. Internal blood flow present on color Doppler imaging.  Left ovary  Not visualized on transvaginal imaging question due to high position versus obscuration by bowel gas.  Other findings:  No free pelvic fluid or adnexal masses  IMPRESSION: Submucosal uterine leiomyoma mid LEFT uterus posteriorly 3.7 x 3.4 x 4.9 cm.  Nonvisualization of LEFT ovary.   Electronically Signed   By: Lavonia Dana M.D.   On: 01/30/2017 12:54  01/2017 EmBx- negative for malignancy  A/P 47 yo with  DUB likely due to perimenopausal state and submucosal fibroid - Discussed medical management with progesterone and Mirena IUD - Also discussed surgical management with endometrial ablation which may eliminate the fibroid. - Patient opted for medical management with medication. Rx megace provided - Patient to return if considering IUD or endometrial ablation

## 2017-04-23 NOTE — Progress Notes (Signed)
Pt states that she is having bleeding every day. Not heavy but daily, with use of pills.  Pt states some dull achy pain.

## 2017-09-14 ENCOUNTER — Ambulatory Visit (INDEPENDENT_AMBULATORY_CARE_PROVIDER_SITE_OTHER): Payer: 59 | Admitting: Physician Assistant

## 2017-09-14 ENCOUNTER — Encounter: Payer: Self-pay | Admitting: Physician Assistant

## 2017-09-14 VITALS — BP 136/76 | HR 75 | Wt 152.0 lb

## 2017-09-14 DIAGNOSIS — H8111 Benign paroxysmal vertigo, right ear: Secondary | ICD-10-CM

## 2017-09-14 DIAGNOSIS — K047 Periapical abscess without sinus: Secondary | ICD-10-CM | POA: Diagnosis not present

## 2017-09-14 DIAGNOSIS — G4452 New daily persistent headache (NDPH): Secondary | ICD-10-CM

## 2017-09-14 MED ORDER — KETOROLAC TROMETHAMINE 60 MG/2ML IM SOLN
60.0000 mg | Freq: Once | INTRAMUSCULAR | Status: AC
  Administered 2017-09-14: 60 mg via INTRAMUSCULAR

## 2017-09-14 MED ORDER — MECLIZINE HCL 25 MG PO TABS: 25.0000 mg | ORAL_TABLET | Freq: Three times a day (TID) | ORAL | 0 refills | Status: AC | PRN

## 2017-09-14 MED ORDER — DEXAMETHASONE SODIUM PHOSPHATE 10 MG/ML IJ SOLN
8.0000 mg | Freq: Once | INTRAMUSCULAR | Status: AC
  Administered 2017-09-14: 8 mg via INTRAMUSCULAR

## 2017-09-14 NOTE — Progress Notes (Signed)
Subjective:    Patient ID: Sabrina Berger, female    DOB: [birth year], 47 y.o.   MRN: [id number]  HPI  Pt is a 47 yo pleasant female who presents to the clinic with her daughter to interpret for headache that has persisted for the last 2 weeks. She described the headache and dull and more frontal/temporal with a lot of pressure. She is concerned it is coming from her megace to control vaginal bleeding from fibroid but she started that months ago in feb and just now started with headache. She also has a tooth that is infected and cracked. Her dentist gave her amoxicillin and she finished it yesterday. She will have it pulled this week. She complains of feeling a little "off balance" and "dizzy feeling" occasionally. No fever, chills, body aches. She is taking ibuprofen and tylenol for headache which helps some.    Because she feels so bad she wonders if she has diabetes or other chronic diseases.   .. Active Ambulatory Problems    Diagnosis Date Noted  . Overweight (BMI 25.0-29.9) 05/02/2016   Resolved Ambulatory Problems    Diagnosis Date Noted  . No Resolved Ambulatory Problems   Past Medical History:  Diagnosis Date  . Hypertriglyceridemia   . Uterine fibroid        Review of Systems See  HPI.     Objective:   Physical Exam  Constitutional: She is oriented to person, place, and time. She appears well-developed and well-nourished.  HENT:  Head: Normocephalic and atraumatic.  Mouth/Throat: Oropharynx is clear and moist.  Left upper molar cracked broken off tooth at base. Erythematous gums.  Tenderness over bilateral temples.   Eyes: Pupils are equal, round, and reactive to light. EOM are normal.  Neck: Normal range of motion. Neck supple.  Cardiovascular: Normal rate, regular rhythm and normal heart sounds.  Pulmonary/Chest: Effort normal and breath sounds normal.  Abdominal: Soft. Bowel sounds are normal.  Neurological: She is alert and oriented to person, place, and time.  She has normal strength. No cranial nerve deficit. She displays a negative Romberg sign. Coordination and gait normal.  Positive Dix Hallpike to the right with nystagmus and dizziness.   Skin: No rash noted.  Psychiatric: She has a normal mood and affect. Her behavior is normal.          Assessment & Plan:  Marland KitchenMarland KitchenTaja was seen today for headache.  Diagnoses and all orders for this visit:  BPPV (benign paroxysmal positional vertigo), right -     meclizine (ANTIVERT) 25 MG tablet; Take 1 tablet (25 mg total) by mouth 3 (three) times daily as needed for dizziness.  Dental infection -     TSH -     CBC with Differential/Platelet -     COMPLETE METABOLIC PANEL WITH GFR -     Sed Rate (ESR)  New daily persistent headache -     TSH -     CBC with Differential/Platelet -     COMPLETE METABOLIC PANEL WITH GFR -     Sed Rate (ESR) -     dexamethasone (DECADRON) injection 8 mg -     ketorolac (TORADOL) injection 60 mg   New Headache that could be coming from broken/infected tooth. Will do a work up in blood to look at ESR, glucose, infection. Treated with migraine cocktail decadron and toradol for headache today. On exam pt had a very positive dix hallpike to the right. Treated with epley manuevers at home and antivert  as needed. Discussed dx in detail. Certainly follow up with any new or worsening symptoms or if symptoms do not resolve in the next week. Written out of work for 1 day and for the next week wrote her out of using heavy loud machinery.   Reassured I do not think it is coming from your megace.   Follow up in 1-2 weeks with PCP for follow up.   Marland KitchenMarland KitchenSpent 30 minutes with patient and greater than 50 percent of visit spent counseling patient regarding treatment plan.

## 2017-09-14 NOTE — Patient Instructions (Addendum)
Will get labs.  Will get shots today.  Try excedrin migraine if headache returns.  antivert as needed for dizziness.  Start epley maneuvers.    Dolor de cabeza general sin causa General Headache Without Cause El dolor de cabeza es un dolor o malestar que se siente en la zona de la cabeza o del cuello. Puede no tener una causa especfica. Hay muchas causas y tipos de dolores de [place]. Los dolores de cabeza ms comunes son los siguientes:  Cefalea tensional.  Cefaleas migraosas.  Cefalea en brotes.  Cefaleas diarias crnicas.  Siga estas indicaciones en su casa:  Controle su afeccin para detectar cualquier cambio. Siga estos pasos para Building surveyor afeccin: Control del [employer] medicamentos de venta libre y los recetados solamente como se lo haya indicado el mdico.  Cuando sienta dolor de cabeza acustese en un cuarto oscuro y tranquilo.  Si se lo indican, aplique hielo sobre la cabeza y la zona del cuello: ? Ponga el hielo en una bolsa plstica. ? Coloque una [employer] piel y la bolsa de hielo. ? Deje el hielo durante 32minutos, 2 a 3veces por da.  Utilice una almohadilla trmica o tome una ducha con agua caliente para aplicar calor en la cabeza y la zona del cuello como se lo haya indicado el [place] luces tenues si las luces brillantes le [place] o sus dolores de cabeza empeoran. Qu debe comer y beber  [place] un horario para las comidas.  Limite el consumo de bebidas alcohlicas.  Consuma menos cantidad de cafena o deje de tomarla. Instrucciones generales  Concurra a todas las visitas de seguimiento como se lo haya indicado el mdico. Esto es importante.  Lleve un diario de los dolores de cabeza para Neurosurgeon qu factores pueden desencadenarlos. Por ejemplo, escriba: ? Lo que usted come y bebe. ? Cunto tiempo duerme. ? Algn cambio en su dieta o en los medicamentos.  Pruebe algunas tcnicas de relajacin, como los  [place].  Limite el estrs.  Sintese con la espalda recta y no tense los msculos.  No consuma productos que contengan tabaco, incluidos cigarrillos, tabaco de Higher education careers adviser o cigarrillos electrnicos. Si necesita ayuda para dejar de fumar, consulte al mdico.  Haga actividad fsica habitualmente como se lo haya indicado el mdico.  Tenga un horario fijo para dormir. Duerma entre 7 y 9horas o la cantidad de horas que le haya recomendado el mdico. Comunquese con un mdico si:  Los medicamentos no Dealer los sntomas.  Tiene un dolor de cabeza que es diferente del dolor de cabeza habitual.  Tiene nuseas o vmitos.  Tiene fiebre. Solicite ayuda de inmediato si:  El dolor se vuelve cada vez ms intenso.  Ha vomitado repetidas veces.  Presenta rigidez en el cuello.  Sufre prdida de la visin.  Tiene problemas para hablar.  Siente dolor en el ojo o en el odo.  Presenta debilidad muscular o prdida del control muscular.  Pierde el equilibrio o tiene problemas para Writer.  Sufre mareos o se desmaya.  Experimenta confusin. Esta informacin no tiene Marine scientist el consejo del mdico. Asegrese de hacerle al mdico cualquier pregunta que tenga. Document Released: 11/27/2004 Document Revised: 06/09/2016 Document Reviewed: 06/12/2014 Elsevier Interactive Patient Education  [employer].

## 2017-09-15 LAB — COMPLETE METABOLIC PANEL WITH GFR
AG RATIO: 1.3 (calc) (ref 1.0–2.5)
ALBUMIN MSPROF: 3.9 g/dL (ref 3.6–5.1)
ALKALINE PHOSPHATASE (APISO): 36 U/L (ref 33–115)
ALT: 29 U/L (ref 6–29)
AST: 34 U/L (ref 10–35)
BUN: 9 mg/dL (ref 7–25)
CHLORIDE: 105 mmol/L (ref 98–110)
CO2: 25 mmol/L (ref 20–32)
Calcium: 9.2 mg/dL (ref 8.6–10.2)
Creat: 0.71 mg/dL (ref 0.50–1.10)
GFR, EST AFRICAN AMERICAN: 117 mL/min/{1.73_m2} (ref >=60)
GFR, Est Non African American: 101 mL/min/{1.73_m2} (ref >=60)
GLOBULIN: 3.1 g/dL (ref 1.9–3.7)
Glucose, Bld: 86 mg/dL (ref 65–99)
POTASSIUM: 4.1 mmol/L (ref 3.5–5.3)
SODIUM: 138 mmol/L (ref 135–146)
TOTAL PROTEIN: 7 g/dL (ref 6.1–8.1)
Total Bilirubin: 0.3 mg/dL (ref 0.2–1.2)

## 2017-09-15 LAB — CBC WITH DIFFERENTIAL/PLATELET
Basophils Absolute: 30 cells/uL (ref 0–200)
Basophils Relative: 0.4 %
Eosinophils Absolute: 96 cells/uL (ref 15–500)
Eosinophils Relative: 1.3 %
HCT: 39.7 % (ref 35.0–45.0)
Hemoglobin: 13.3 g/dL (ref 11.7–15.5)
LYMPHS ABS: 2338 {cells}/uL (ref 850–3900)
MCH: 31.6 pg (ref 27.0–33.0)
MCHC: 33.5 g/dL (ref 32.0–36.0)
MCV: 94.3 fL (ref 80.0–100.0)
MPV: 10.2 fL (ref 7.5–12.5)
Monocytes Relative: 10.2 %
Neutro Abs: 4181 cells/uL (ref 1500–7800)
Neutrophils Relative %: 56.5 %
Platelets: 309 10*3/uL (ref 140–400)
RBC: 4.21 10*6/uL (ref 3.80–5.10)
RDW: 13 % (ref 11.0–15.0)
TOTAL LYMPHOCYTE: 31.6 %
WBC: 7.4 10*3/uL (ref 3.8–10.8)
WBCMIX: 755 {cells}/uL (ref 200–950)

## 2017-09-15 LAB — SEDIMENTATION RATE: Sed Rate: 11 mm/h (ref 0–20)

## 2017-09-15 LAB — TSH: TSH: 1.55 mIU/L

## 2017-09-15 NOTE — Progress Notes (Signed)
Call pt: thyroid is normal. No anemia. Kidney, liver glucose look great.

## 2017-09-17 DIAGNOSIS — K047 Periapical abscess without sinus: Secondary | ICD-10-CM | POA: Insufficient documentation

## 2017-09-17 DIAGNOSIS — G4452 New daily persistent headache (NDPH): Secondary | ICD-10-CM | POA: Insufficient documentation

## 2017-09-17 DIAGNOSIS — H8111 Benign paroxysmal vertigo, right ear: Secondary | ICD-10-CM | POA: Insufficient documentation

## 2017-11-09 ENCOUNTER — Encounter: Payer: Self-pay | Admitting: Certified Nurse Midwife

## 2017-11-09 ENCOUNTER — Ambulatory Visit (INDEPENDENT_AMBULATORY_CARE_PROVIDER_SITE_OTHER): Payer: 59 | Admitting: Certified Nurse Midwife

## 2017-11-09 VITALS — BP 148/79 | HR 82 | Ht 62.0 in | Wt 153.0 lb

## 2017-11-09 DIAGNOSIS — Z113 Encounter for screening for infections with a predominantly sexual mode of transmission: Secondary | ICD-10-CM

## 2017-11-09 DIAGNOSIS — N939 Abnormal uterine and vaginal bleeding, unspecified: Secondary | ICD-10-CM | POA: Diagnosis not present

## 2017-11-09 DIAGNOSIS — N898 Other specified noninflammatory disorders of vagina: Secondary | ICD-10-CM

## 2017-11-09 DIAGNOSIS — B9689 Other specified bacterial agents as the cause of diseases classified elsewhere: Secondary | ICD-10-CM

## 2017-11-09 DIAGNOSIS — R102 Pelvic and perineal pain: Secondary | ICD-10-CM

## 2017-11-09 DIAGNOSIS — Z86018 Personal history of other benign neoplasm: Secondary | ICD-10-CM | POA: Diagnosis not present

## 2017-11-09 DIAGNOSIS — N760 Acute vaginitis: Secondary | ICD-10-CM

## 2017-11-09 NOTE — Progress Notes (Signed)
   GYNECOLOGY OFFICE VISIT NOTE  History:  47 y.o. G4P3 here today for follow up of AUB. She had a workup last year that showed negative EBX and 3cm submucosal fibroid. She elected for management with Megace and OCPs. She is no longer taking Megace and is not bleeding however she reports pelvic pain 3 days a week and abdominal bloating after she eats. These new sx started about 1 month ago. She is not currently sexually active. Reports thin watery vaginal discharge, no itching or odor. She was given options of Mirena, ablation, or medicine for her AUB and elected medical mngt.  Past Medical History:  Diagnosis Date  . Hypertriglyceridemia   . Uterine fibroid     Past Surgical History:  Procedure Laterality Date  . NO PAST SURGERIES      The following portions of the patient's history were reviewed and updated as appropriate: allergies, current medications, past family history, past medical history, past social history, past surgical history and problem list.   Health Maintenance:  Normal pap and negative HRHPV on 01/21/18.  Normal mammogram on 03/13/17.   Review of Systems:  Genito-Urinary ROS: no dysuria, trouble voiding, or hematuria positive for - genital discharge and pelvic pain negative for - VB  Objective:  Physical Exam BP (!) 148/79   Pulse 82   Ht 5\' 2"  (1.575 m)   Wt 69.4 kg   LMP  (LMP Unknown)   BMI 27.98 kg/m  CONSTITUTIONAL: Well-developed, well-nourished female in no acute distress.  HENT:  Normocephalic, atraumatic EYES: Conjunctivae and EOM are normal NECK: Normal range of motion SKIN: Skin is warm and dry NEUROLOGIC: Alert and oriented to person, place, and time PSYCHIATRIC: Normal mood and affect CARDIOVASCULAR: Normal heart rate noted RESPIRATORY: Effort and rate normal ABDOMEN: Soft, no distention, NT  PELVIC: Normal external genitalia, thin clear discharge in vault. Uterus enlarged, nontender, mobile. No adnexal mass or tenderness. Cervix normal.    MUSCULOSKELETAL: Normal range of motion  Labs and Imaging No results found.  Assessment & Plan:   1. Pelvic pain   2. History of uterine fibroid   3. Abnormal uterine bleeding (AUB)    Oneswab today Pelvic US this week Discontinue OCPs Follow up after [place], for further mngt >may elect for Liletta IUD (discussed this option and brochure given)  Total face-to-face time with patient: 15 minutes   Julianne Handler, CNM 11/09/2017 11:47 AM

## 2017-11-09 NOTE — Progress Notes (Signed)
Pt was told to f/u after she finished Megace. Has not bled since starting the Megace. Needs refill on OCP

## 2017-11-10 LAB — CERVICOVAGINAL ANCILLARY ONLY
BACTERIAL VAGINITIS: POSITIVE — AB
CHLAMYDIA, DNA PROBE: NEGATIVE
Candida vaginitis: NEGATIVE
NEISSERIA GONORRHEA: NEGATIVE
Trichomonas: NEGATIVE

## 2017-11-12 ENCOUNTER — Telehealth: Payer: Self-pay | Admitting: *Deleted

## 2017-11-12 MED ORDER — METRONIDAZOLE 500 MG PO TABS: 500.0000 mg | ORAL_TABLET | Freq: Two times a day (BID) | ORAL | 0 refills | Status: AC

## 2017-11-12 NOTE — Telephone Encounter (Signed)
-----   Message from Julianne Handler, [place] sent at 11/11/2017  4:24 PM EDT ----- +BV, please send Flagyl

## 2017-11-13 ENCOUNTER — Ambulatory Visit (INDEPENDENT_AMBULATORY_CARE_PROVIDER_SITE_OTHER): Payer: 59

## 2017-11-13 ENCOUNTER — Other Ambulatory Visit: Payer: Self-pay | Admitting: Certified Nurse Midwife

## 2017-11-13 DIAGNOSIS — R102 Pelvic and perineal pain: Secondary | ICD-10-CM

## 2017-11-13 DIAGNOSIS — D259 Leiomyoma of uterus, unspecified: Secondary | ICD-10-CM

## 2017-11-16 ENCOUNTER — Telehealth: Payer: Self-pay

## 2017-11-16 DIAGNOSIS — N920 Excessive and frequent menstruation with regular cycle: Secondary | ICD-10-CM

## 2017-11-16 MED ORDER — MEGESTROL ACETATE 40 MG PO TABS
40.0000 mg | ORAL_TABLET | Freq: Two times a day (BID) | ORAL | 0 refills | Status: DC
Start: 2017-11-16 — End: 2017-12-08

## 2017-11-16 NOTE — Telephone Encounter (Signed)
PT came in for U/S results. She needs a visit with a MD. Appt was scheduled. Pt wants OCP and Megace refill. Sherryle Lis, RN said to send in the Megace but not OCP until she comes in and talks with a doctor.

## 2017-11-24 ENCOUNTER — Other Ambulatory Visit: Payer: Self-pay

## 2017-11-24 ENCOUNTER — Ambulatory Visit (INDEPENDENT_AMBULATORY_CARE_PROVIDER_SITE_OTHER): Payer: 59 | Admitting: Obstetrics & Gynecology

## 2017-11-24 ENCOUNTER — Encounter: Payer: Self-pay | Admitting: Obstetrics & Gynecology

## 2017-11-24 VITALS — BP 139/88 | HR 97 | Ht 62.0 in | Wt 149.1 lb

## 2017-11-24 DIAGNOSIS — IMO0001 Reserved for inherently not codable concepts without codable children: Secondary | ICD-10-CM

## 2017-11-24 DIAGNOSIS — N938 Other specified abnormal uterine and vaginal bleeding: Secondary | ICD-10-CM | POA: Diagnosis not present

## 2017-11-24 MED ORDER — SPRINTEC 28 0.25-35 MG-MCG PO TABS: 1.0000 | ORAL_TABLET | Freq: Every day | ORAL | 12 refills | Status: DC

## 2017-11-24 NOTE — Progress Notes (Signed)
   Subjective:    Patient ID: Sabrina Berger, female    DOB: Apr 13, 1970, 47 y.o.   MRN: [id number]  HPI 47 yo married Hispanic P3 (23, 34, and 38 yo kids) here to discuss her u/s results. She had DUB, had a negative EMBX last year and a recent u/s that showed a 4cm fibroid. It is partially submucosal.  If she takes megace, then she doesn't bleed. If she stops it, then she bleeds.    Review of Systems Pap normal in 2018 Mammogram 1/19    Objective:   Physical Exam Breathing, conversing, and ambulating normally Well nourished, well hydrated Latina, no apparent distress      Assessment & Plan:  She is consider the Myosure resection of fibroid (submucosal portion) and Minerva ablation. She will call her insurance company to find out how much it would cost her. She will call [facility] Pontes and tell her if she wants to have this scheduled.

## 2017-12-07 ENCOUNTER — Other Ambulatory Visit: Payer: Self-pay | Admitting: Obstetrics & Gynecology

## 2017-12-07 DIAGNOSIS — N920 Excessive and frequent menstruation with regular cycle: Secondary | ICD-10-CM

## 2017-12-08 ENCOUNTER — Other Ambulatory Visit: Payer: Self-pay | Admitting: *Deleted

## 2017-12-08 DIAGNOSIS — N920 Excessive and frequent menstruation with regular cycle: Secondary | ICD-10-CM

## 2017-12-08 MED ORDER — MEGESTROL ACETATE 40 MG PO TABS: 40.0000 mg | ORAL_TABLET | Freq: Two times a day (BID) | ORAL | 0 refills | Status: DC

## 2018-01-22 ENCOUNTER — Other Ambulatory Visit: Payer: Self-pay | Admitting: *Deleted

## 2018-01-22 DIAGNOSIS — N920 Excessive and frequent menstruation with regular cycle: Secondary | ICD-10-CM

## 2018-01-22 MED ORDER — MEGESTROL ACETATE 40 MG PO TABS: 40.0000 mg | ORAL_TABLET | Freq: Two times a day (BID) | ORAL | 0 refills | Status: AC

## 2018-02-25 ENCOUNTER — Encounter: Payer: Self-pay | Admitting: Obstetrics & Gynecology

## 2018-04-13 ENCOUNTER — Encounter (HOSPITAL_COMMUNITY): Payer: Self-pay

## 2018-04-20 ENCOUNTER — Encounter: Payer: Self-pay | Admitting: *Deleted

## 2018-04-27 NOTE — Progress Notes (Signed)
UTR pt with multiple attempts over the last week. Spoke with Elmyra Ricks at [facility] office. She will let Dr Hulan Fray know.

## 2018-04-28 ENCOUNTER — Ambulatory Visit (HOSPITAL_BASED_OUTPATIENT_CLINIC_OR_DEPARTMENT_OTHER): Admission: RE | Admit: 2018-04-28 | Payer: 59 | Source: Home / Self Care | Admitting: Obstetrics & Gynecology

## 2018-04-28 ENCOUNTER — Encounter (HOSPITAL_COMMUNITY): Payer: Self-pay | Admitting: Anesthesiology

## 2018-04-28 ENCOUNTER — Encounter (HOSPITAL_BASED_OUTPATIENT_CLINIC_OR_DEPARTMENT_OTHER): Admission: RE | Payer: Self-pay | Source: Home / Self Care

## 2018-04-28 SURGERY — DILATATION & CURETTAGE/HYSTEROSCOPY WITH MYOSURE
Anesthesia: Choice

## 2018-06-28 ENCOUNTER — Other Ambulatory Visit: Payer: Self-pay | Admitting: *Deleted

## 2018-06-28 DIAGNOSIS — N938 Other specified abnormal uterine and vaginal bleeding: Secondary | ICD-10-CM

## 2018-06-28 MED ORDER — SPRINTEC 28 0.25-35 MG-MCG PO TABS: 1.0000 | ORAL_TABLET | Freq: Every day | ORAL | 12 refills | Status: DC

## 2018-07-20 ENCOUNTER — Telehealth: Payer: Self-pay

## 2018-07-20 DIAGNOSIS — N938 Other specified abnormal uterine and vaginal bleeding: Secondary | ICD-10-CM

## 2018-07-20 MED ORDER — SPRINTEC 28 0.25-35 MG-MCG PO TABS: 1.0000 | ORAL_TABLET | Freq: Every day | ORAL | 12 refills | Status: AC

## 2018-07-20 NOTE — Telephone Encounter (Signed)
Pt states that [facility] [address] in [place] does not have Rx for OCP. Rx was sent on 04/23. Previous Rx discontinued and new Rx sent.

## 2019-04-18 IMAGING — US US TRANSVAGINAL NON-OB
1 series · 14 of 25 positions shown · non-contrast
Comparison: None available

CLINICAL DATA: Uterine fibroid, followup

EXAM:
ULTRASOUND PELVIS TRANSVAGINAL
TECHNIQUE: Transvaginal ultrasound examination of the pelvis was performed
including evaluation of the uterus, ovaries, adnexal regions, and
pelvic cul-de-sac. Transabdominal imaging was not performed

[Series 1: us transvaginal non-ob · 0.15mm/px · 14 of 37 slices shown]
[im 1/37]
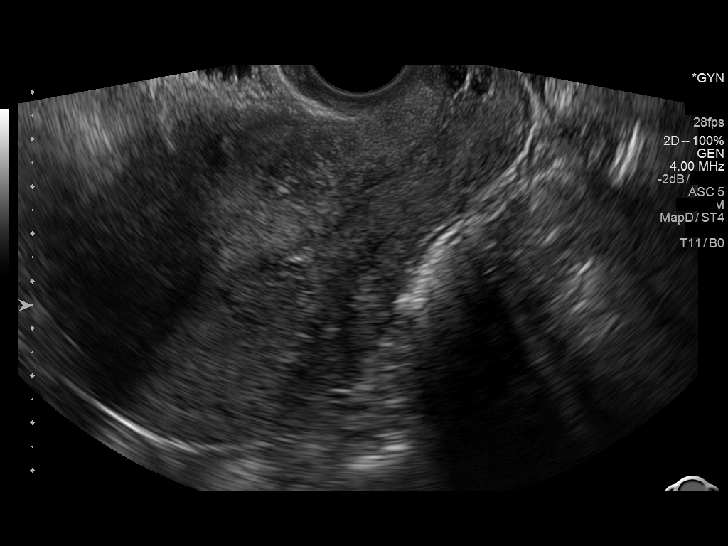
[im 4/37]
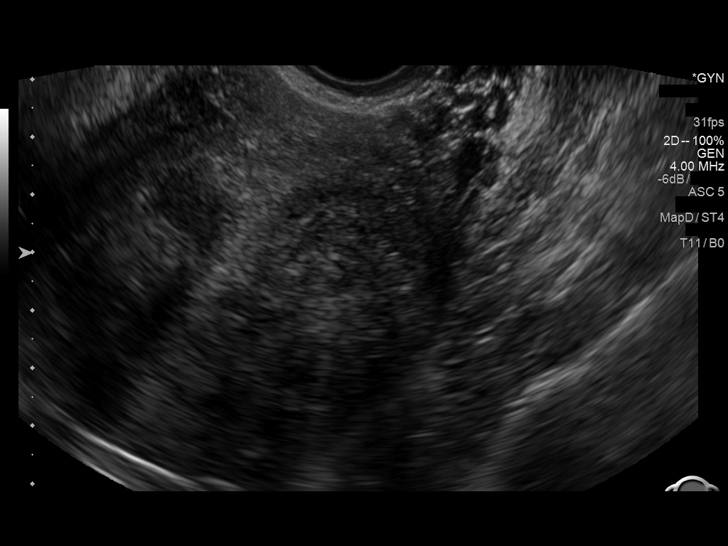
[im 7/37]
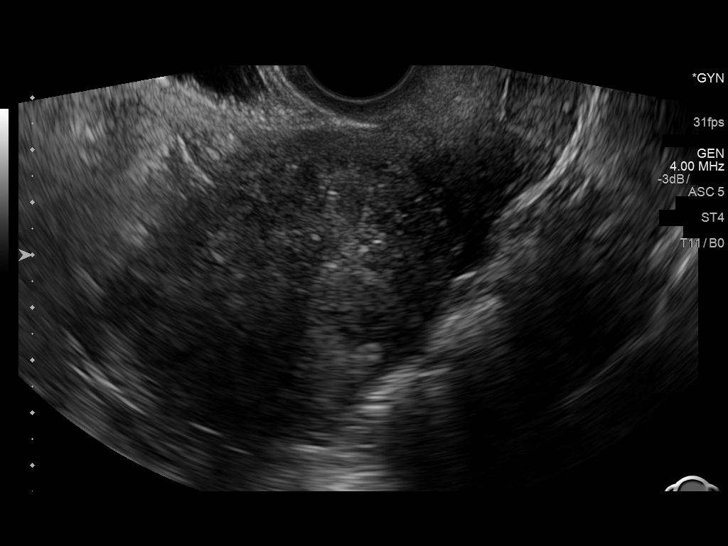
[im 10/37]
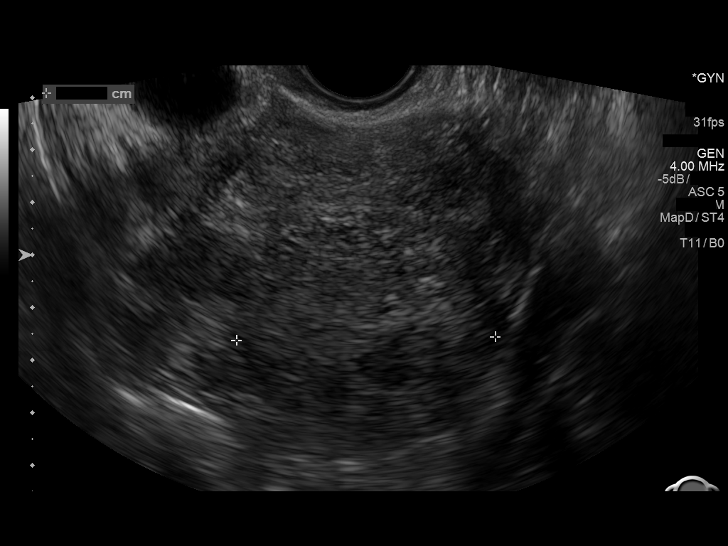
[im 13/37]
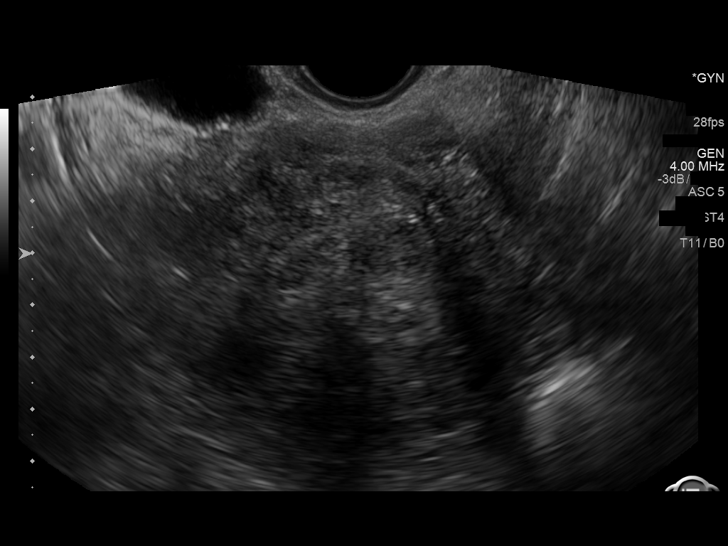
[im 14/37]
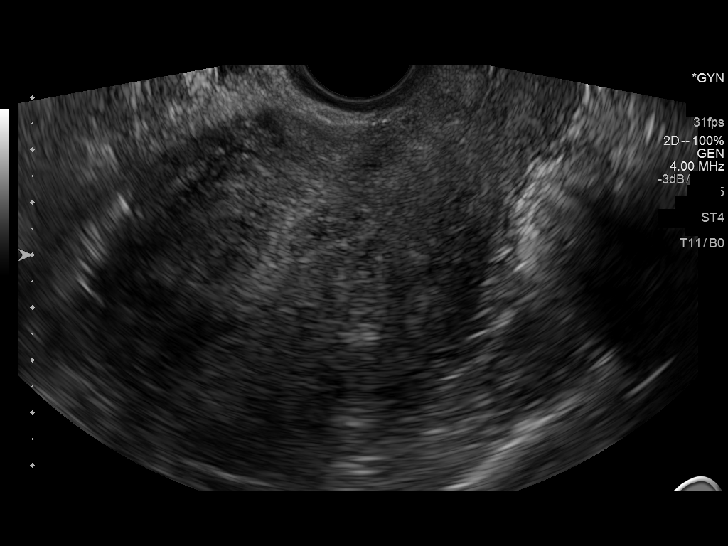
[im 17/37]
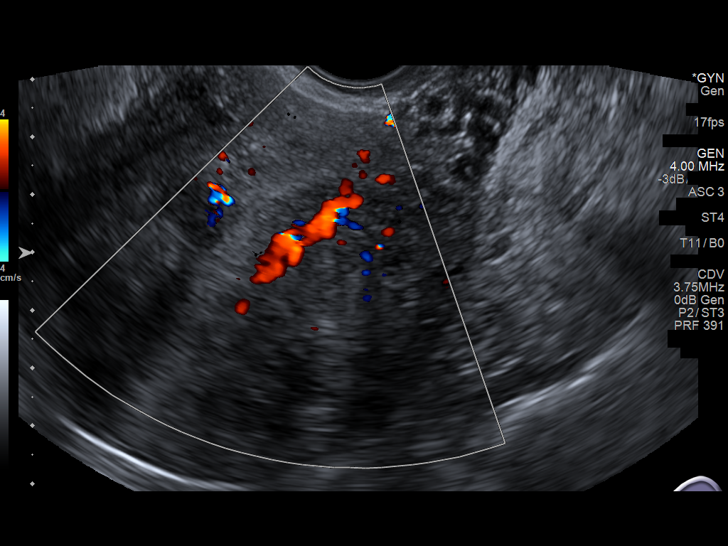
[im 20/37]
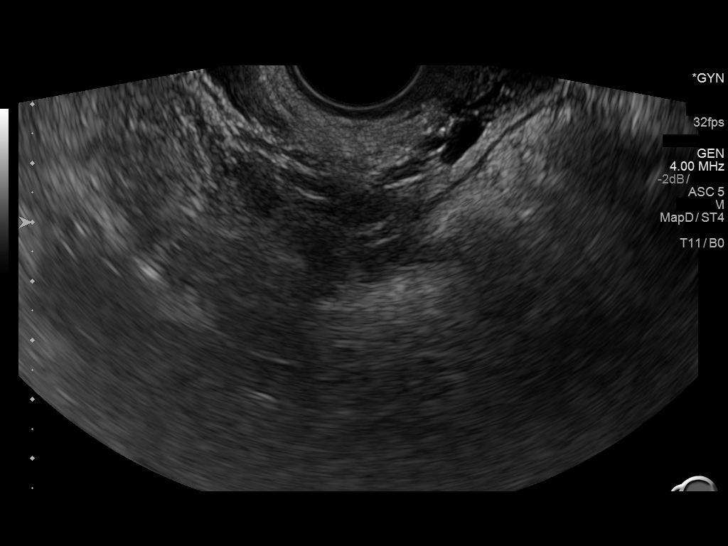
[im 23/37]
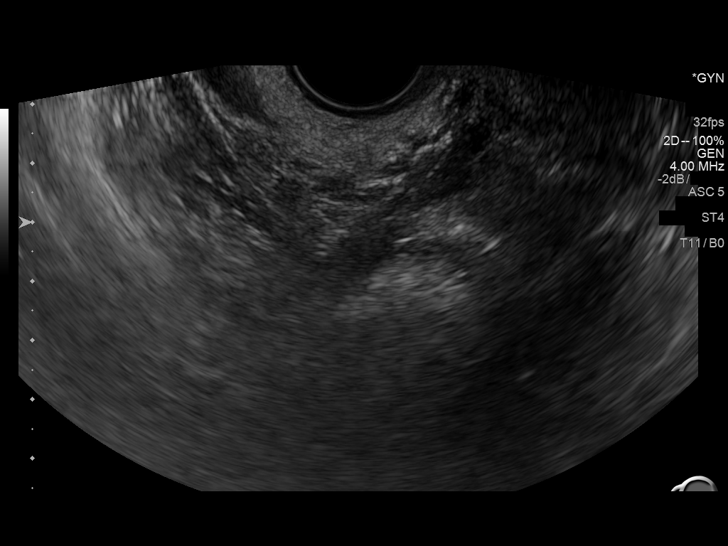
[im 25/37]
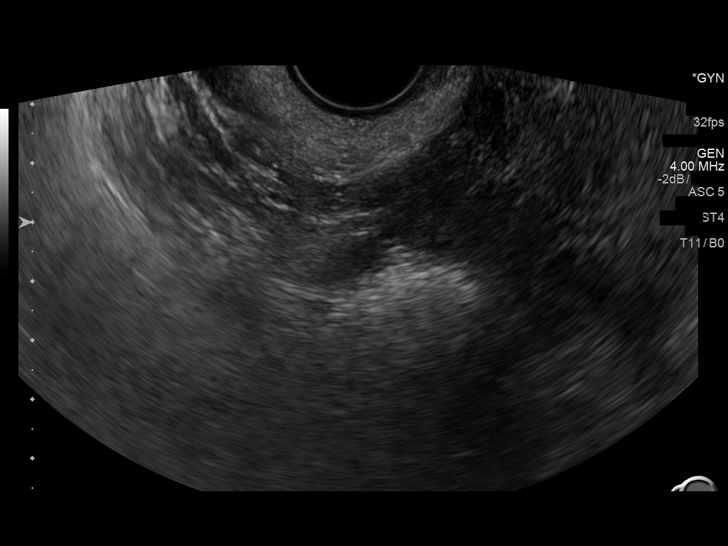
[im 28/37]
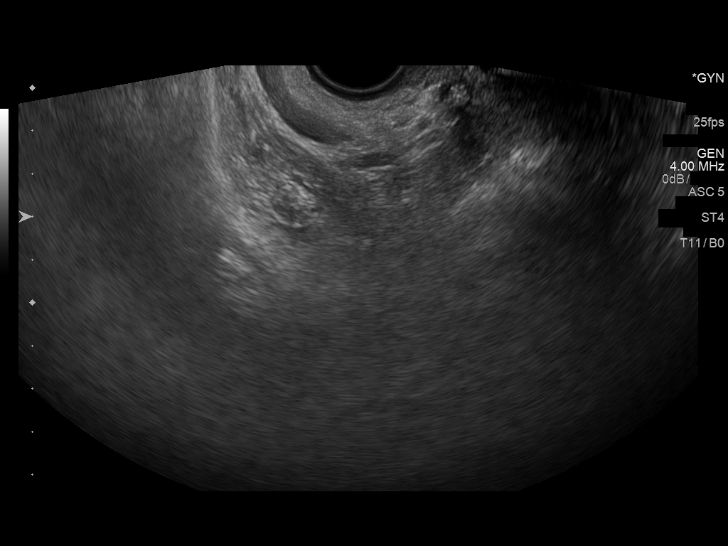
[im 31/37]
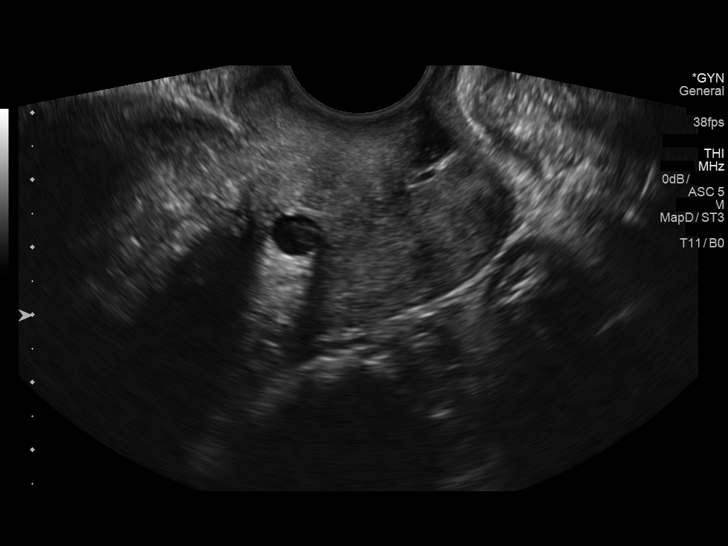
[im 34/37]
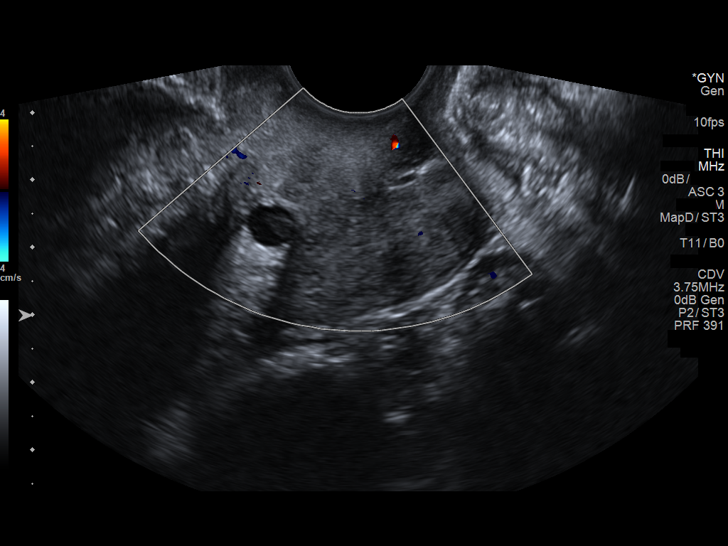
[im 37/37]
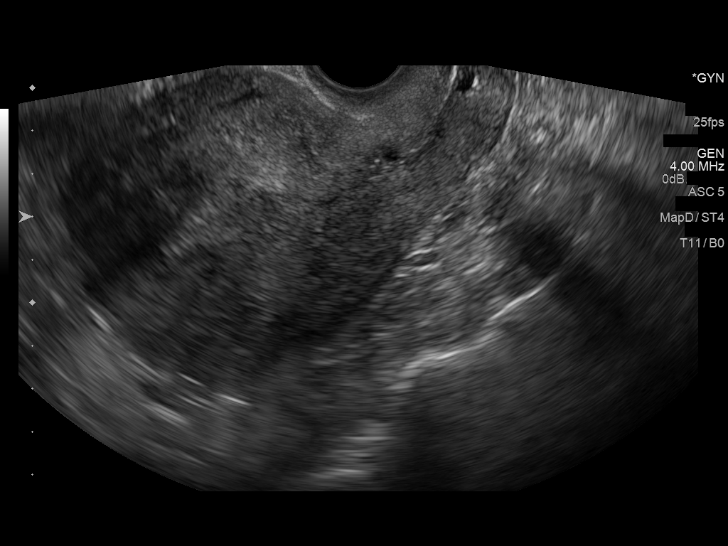

[14 of 25 positions shown; findings below may reference images not displayed]

FINDINGS: Uterus

Measurements: 11.1 x 6.7 x 7.2 cm. Heterogeneous myometrial
echogenicity. Posterior mid LEFT uterine mass likely a leiomyoma
x 3.4 x 4.9 cm. This extends submucosal. No additional uterine
masses. Incidental note of a small nabothian cysts in cervix.

Endometrium

Thickness: 8 mm thick, normal. No endometrial fluid or focal
abnormality

Right ovary

Measurements: 3.3 x 1.5 x 2.1 cm. Normal morphology without mass.
Internal blood flow present on color Doppler imaging.

Left ovary

Not visualized on transvaginal imaging question due to high position
versus obscuration by bowel gas.

Other findings:  No free pelvic fluid or adnexal masses
IMPRESSION: Submucosal uterine leiomyoma mid LEFT uterus posteriorly 3.7 x 3.4 x
4.9 cm.

Nonvisualization of LEFT ovary.

## 2020-01-30 IMAGING — US US PELVIS COMPLETE TRANSABD/TRANSVAG
1 series · 14 of 25 positions shown · non-contrast
Comparison: None

CLINICAL DATA: Patient with chronic pelvic pain. History of uterine
fibroids.

EXAM:
TRANSABDOMINAL AND TRANSVAGINAL ULTRASOUND OF PELVIS
TECHNIQUE: Both transabdominal and transvaginal ultrasound examinations of the
pelvis were performed. Transabdominal technique was performed for
global imaging of the pelvis including uterus, ovaries, adnexal
regions, and pelvic cul-de-sac. It was necessary to proceed with
endovaginal exam following the transabdominal exam to visualize the
adnexal structures and endometrium.

[Series 1: us pelvis complete transabd/transvag · 0.22mm/px · 14 of 57 slices shown]
[im 1/57]
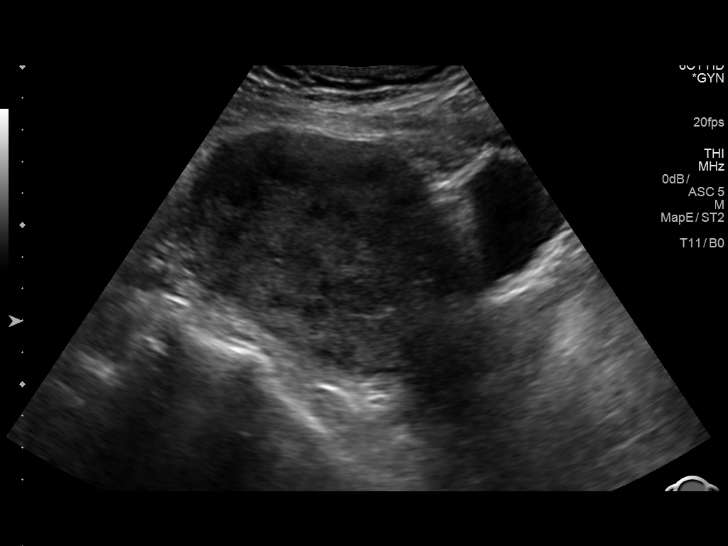
[im 5/57]
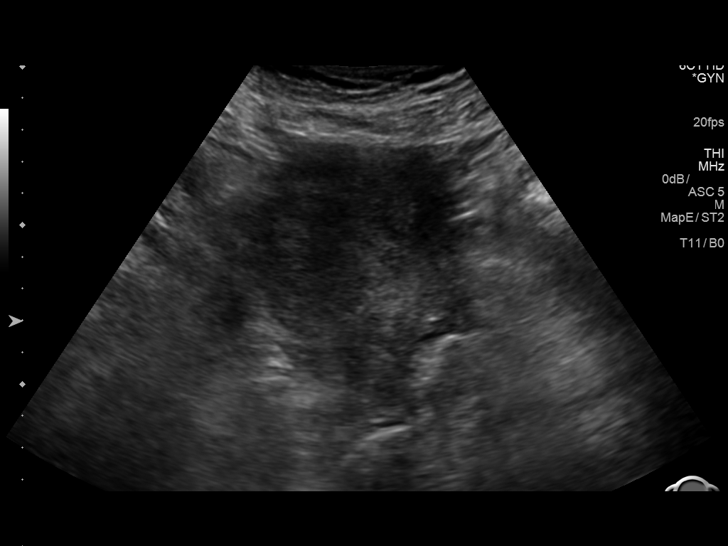
[im 10/57]
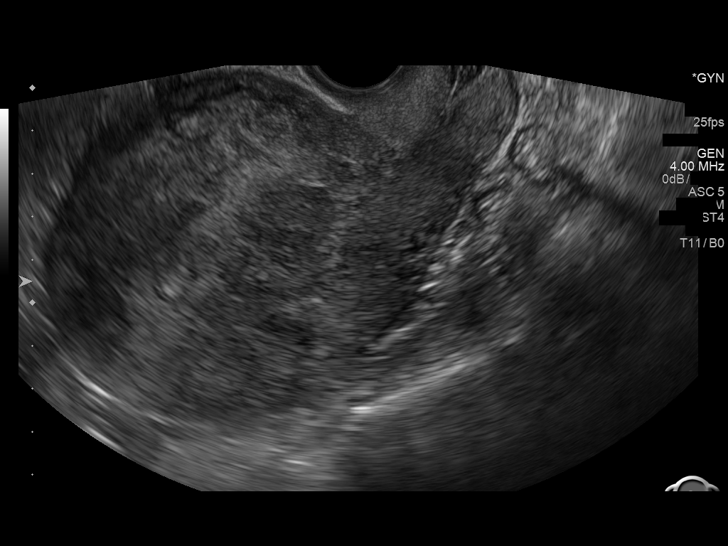
[im 15/57]
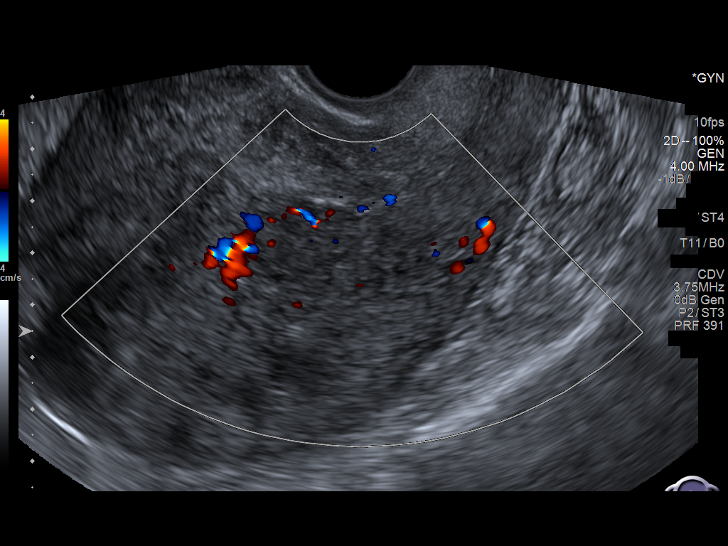
[im 19/57]
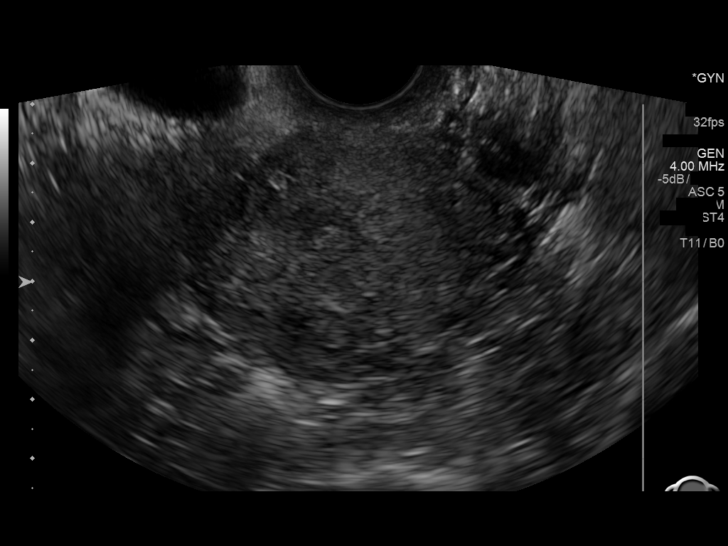
[im 22/57]
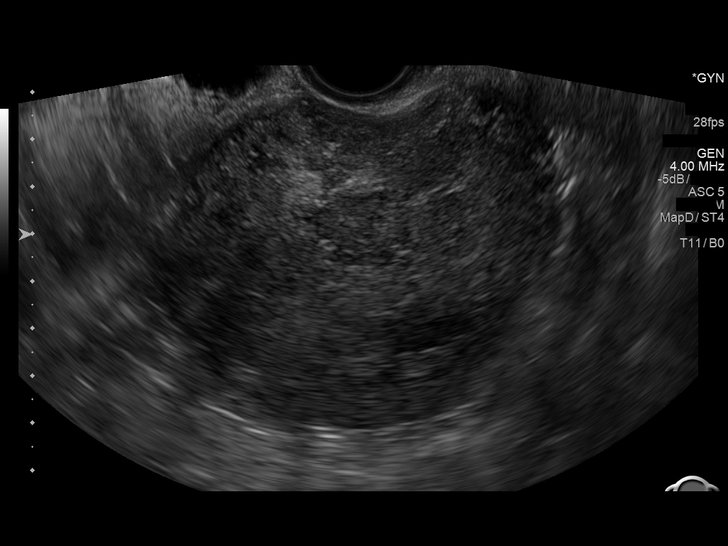
[im 26/57]
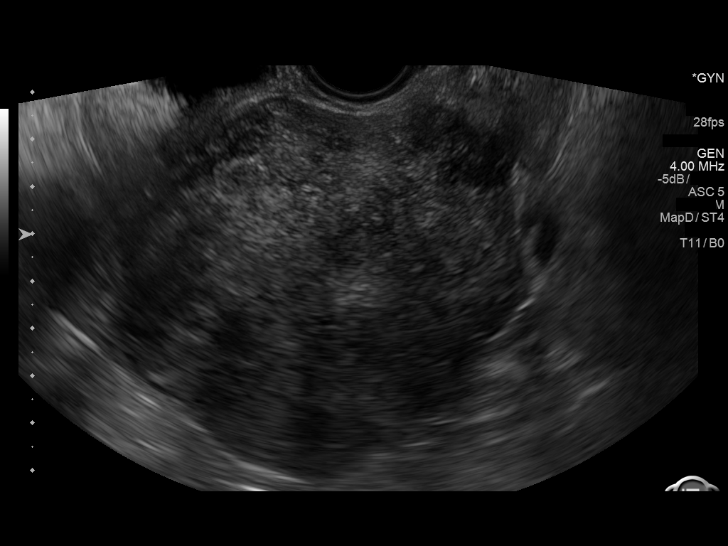
[im 31/57]
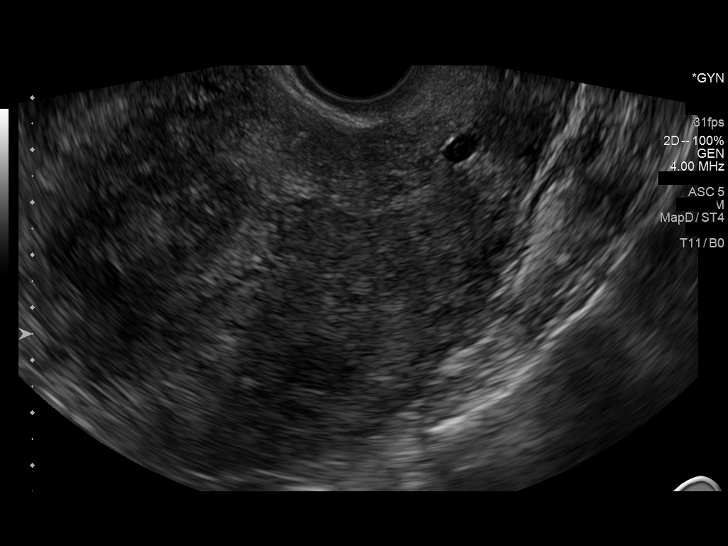
[im 36/57]
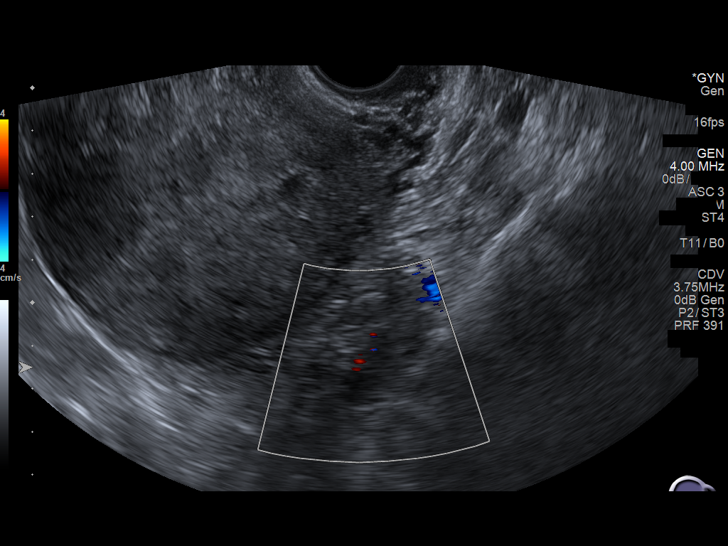
[im 38/57]
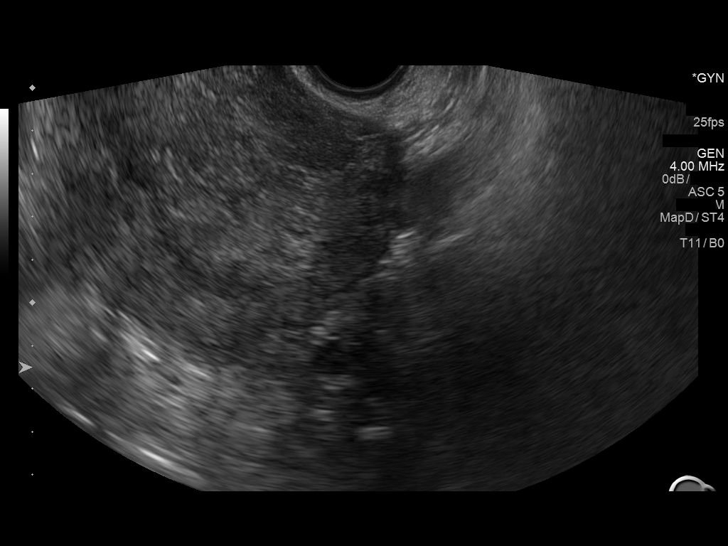
[im 43/57]
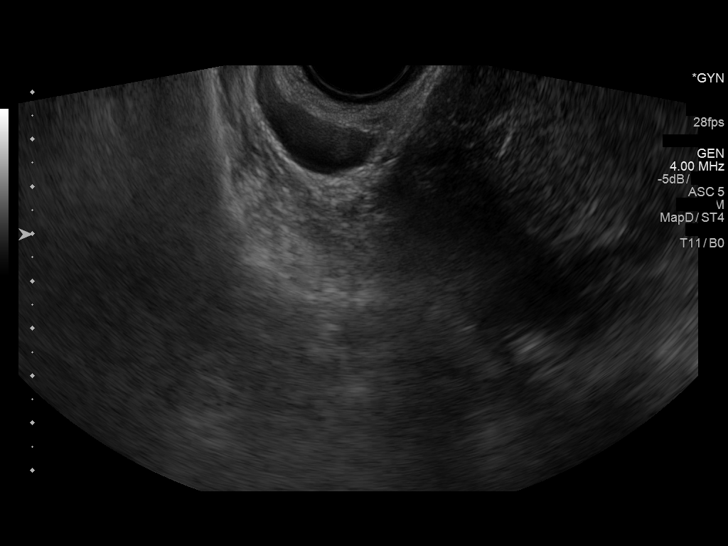
[im 47/57]
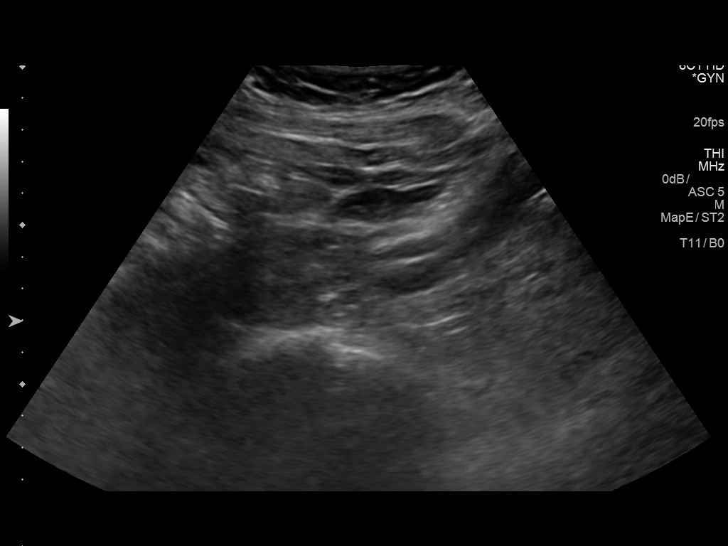
[im 52/57]
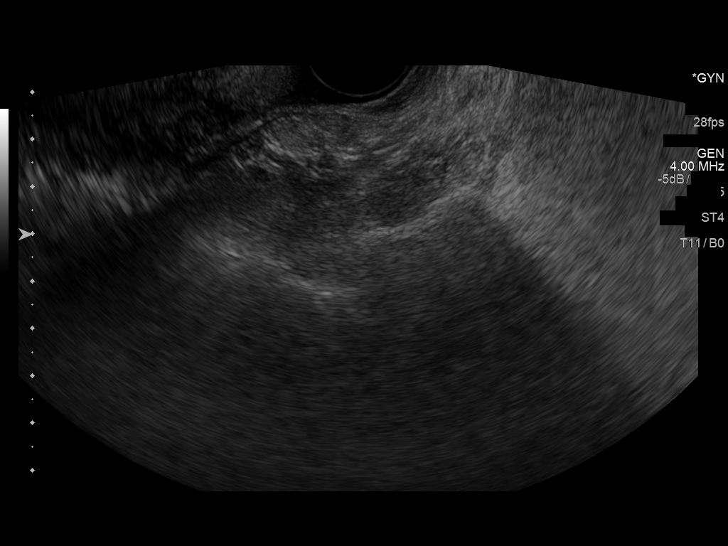
[im 57/57]
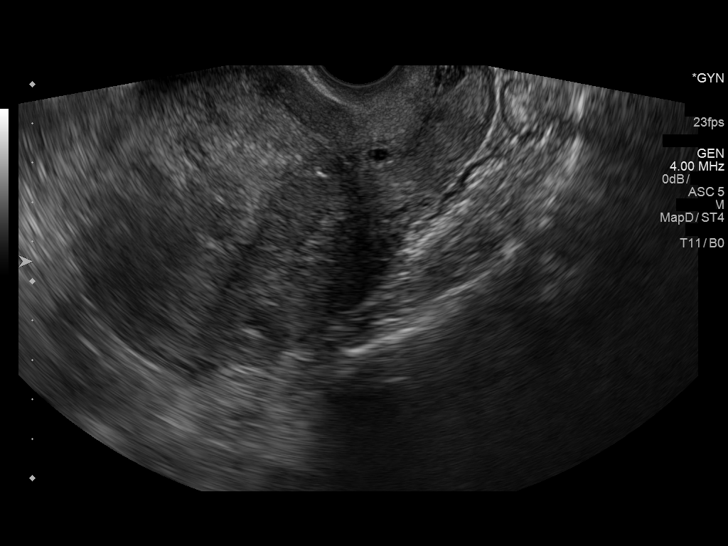

[14 of 25 positions shown; findings below may reference images not displayed]

FINDINGS: Uterus

Measurements: 11.7 x 8.0 x 0.4 cm. There is a 4.4 x 4.2 x 5.0 cm
fibroid within the posterior left uterine body.

Endometrium

Thickness: 10 mm.  No focal abnormality visualized.

Right ovary

Not visualized.

Left ovary

Measurements: 3.1 x 1.9 x 2.1 cm. Normal appearance/no adnexal mass.

Other findings

No abnormal free fluid.
IMPRESSION: Large fibroid posterior left uterine body.  No acute process.
